# Patient Record
Sex: Female | Born: 1945 | Race: White | Hispanic: No | Smoking: Never smoker
Health system: Southern US, Community
[De-identification: ages and names within clinical notes are randomized; demographics above are authoritative.]

## PROBLEM LIST (undated history)

## (undated) DIAGNOSIS — M1990 Unspecified osteoarthritis, unspecified site: Secondary | ICD-10-CM

## (undated) DIAGNOSIS — J8410 Pulmonary fibrosis, unspecified: Secondary | ICD-10-CM

## (undated) DIAGNOSIS — E0500 Thyrotoxicosis with diffuse goiter without thyrotoxic crisis or storm: Secondary | ICD-10-CM

## (undated) DIAGNOSIS — E079 Disorder of thyroid, unspecified: Secondary | ICD-10-CM

## (undated) DIAGNOSIS — I10 Essential (primary) hypertension: Secondary | ICD-10-CM

## (undated) DIAGNOSIS — C801 Malignant (primary) neoplasm, unspecified: Secondary | ICD-10-CM

## (undated) HISTORY — DX: Essential (primary) hypertension: I10

## (undated) HISTORY — DX: Disorder of thyroid, unspecified: E07.9

## (undated) HISTORY — DX: Malignant (primary) neoplasm, unspecified: C80.1

## (undated) HISTORY — DX: Pulmonary fibrosis, unspecified: J84.10

## (undated) HISTORY — PX: COSMETIC SURGERY: SHX468

## (undated) HISTORY — DX: Unspecified osteoarthritis, unspecified site: M19.90

## (undated) HISTORY — DX: Thyrotoxicosis with diffuse goiter without thyrotoxic crisis or storm: E05.00

---

## 1982-02-24 DIAGNOSIS — I10 Essential (primary) hypertension: Secondary | ICD-10-CM

## 1982-02-24 HISTORY — DX: Essential (primary) hypertension: I10

## 1994-02-24 DIAGNOSIS — E0500 Thyrotoxicosis with diffuse goiter without thyrotoxic crisis or storm: Secondary | ICD-10-CM

## 1994-02-24 HISTORY — DX: Thyrotoxicosis with diffuse goiter without thyrotoxic crisis or storm: E05.00

## 1999-02-25 HISTORY — PX: ABDOMINAL HYSTERECTOMY: SHX81

## 2000-02-25 HISTORY — PX: COLONOSCOPY: SHX174

## 2000-02-25 HISTORY — PX: BREAST BIOPSY: SHX20

## 2000-02-25 HISTORY — PX: BREAST SURGERY: SHX581

## 2003-03-07 ENCOUNTER — Other Ambulatory Visit: Payer: Self-pay

## 2005-10-20 ENCOUNTER — Ambulatory Visit: Payer: Self-pay

## 2005-11-12 ENCOUNTER — Ambulatory Visit: Payer: Self-pay | Admitting: Family Medicine

## 2006-01-05 ENCOUNTER — Ambulatory Visit: Payer: Self-pay | Admitting: Urology

## 2008-02-25 DIAGNOSIS — C801 Malignant (primary) neoplasm, unspecified: Secondary | ICD-10-CM

## 2008-02-25 HISTORY — DX: Malignant (primary) neoplasm, unspecified: C80.1

## 2009-01-09 ENCOUNTER — Ambulatory Visit: Admission: RE | Admit: 2009-01-09 | Discharge: 2009-01-23 | Payer: Self-pay | Admitting: Radiation Oncology

## 2009-01-10 ENCOUNTER — Encounter: Admission: RE | Admit: 2009-01-10 | Discharge: 2009-01-10 | Payer: Self-pay | Admitting: Neurosurgery

## 2009-01-24 ENCOUNTER — Ambulatory Visit: Admission: RE | Admit: 2009-01-24 | Discharge: 2009-02-09 | Payer: Self-pay | Admitting: Radiation Oncology

## 2009-01-29 ENCOUNTER — Encounter: Admission: RE | Admit: 2009-01-29 | Discharge: 2009-01-29 | Payer: Self-pay | Admitting: General Surgery

## 2009-01-31 ENCOUNTER — Ambulatory Visit (HOSPITAL_BASED_OUTPATIENT_CLINIC_OR_DEPARTMENT_OTHER): Admission: RE | Admit: 2009-01-31 | Discharge: 2009-01-31 | Payer: Self-pay | Admitting: General Surgery

## 2009-02-14 ENCOUNTER — Ambulatory Visit: Payer: Self-pay | Admitting: Oncology

## 2009-02-24 ENCOUNTER — Ambulatory Visit: Payer: Self-pay | Admitting: Radiation Oncology

## 2009-02-26 ENCOUNTER — Ambulatory Visit: Payer: Self-pay | Admitting: Radiation Oncology

## 2009-03-01 ENCOUNTER — Ambulatory Visit (HOSPITAL_BASED_OUTPATIENT_CLINIC_OR_DEPARTMENT_OTHER): Admission: RE | Admit: 2009-03-01 | Discharge: 2009-03-01 | Payer: Self-pay | Admitting: General Surgery

## 2009-03-19 ENCOUNTER — Ambulatory Visit: Payer: Self-pay | Admitting: Oncology

## 2009-03-21 ENCOUNTER — Encounter: Payer: Self-pay | Admitting: Oncology

## 2009-03-21 ENCOUNTER — Ambulatory Visit: Admission: RE | Admit: 2009-03-21 | Discharge: 2009-03-21 | Payer: Self-pay | Admitting: Oncology

## 2009-03-21 ENCOUNTER — Ambulatory Visit: Payer: Self-pay | Admitting: Cardiology

## 2009-03-27 ENCOUNTER — Ambulatory Visit: Payer: Self-pay | Admitting: Radiation Oncology

## 2009-03-28 ENCOUNTER — Ambulatory Visit: Payer: Self-pay | Admitting: Oncology

## 2009-03-30 ENCOUNTER — Ambulatory Visit: Payer: Self-pay | Admitting: Internal Medicine

## 2009-04-24 ENCOUNTER — Ambulatory Visit: Payer: Self-pay | Admitting: Internal Medicine

## 2009-04-24 ENCOUNTER — Ambulatory Visit: Payer: Self-pay | Admitting: Radiation Oncology

## 2009-05-25 ENCOUNTER — Ambulatory Visit: Payer: Self-pay | Admitting: Radiation Oncology

## 2009-05-25 ENCOUNTER — Ambulatory Visit: Payer: Self-pay | Admitting: Internal Medicine

## 2009-06-24 ENCOUNTER — Ambulatory Visit: Payer: Self-pay | Admitting: Radiation Oncology

## 2009-06-24 ENCOUNTER — Ambulatory Visit: Payer: Self-pay | Admitting: Internal Medicine

## 2009-07-25 ENCOUNTER — Ambulatory Visit: Payer: Self-pay | Admitting: Radiation Oncology

## 2009-07-25 ENCOUNTER — Ambulatory Visit: Payer: Self-pay | Admitting: Internal Medicine

## 2009-08-04 ENCOUNTER — Emergency Department: Payer: Self-pay | Admitting: Unknown Physician Specialty

## 2009-08-24 ENCOUNTER — Ambulatory Visit: Payer: Self-pay | Admitting: Radiation Oncology

## 2009-08-24 ENCOUNTER — Ambulatory Visit: Payer: Self-pay | Admitting: Internal Medicine

## 2009-09-24 ENCOUNTER — Ambulatory Visit: Payer: Self-pay | Admitting: Internal Medicine

## 2009-09-24 ENCOUNTER — Ambulatory Visit: Payer: Self-pay | Admitting: Radiation Oncology

## 2009-10-25 ENCOUNTER — Ambulatory Visit: Payer: Self-pay | Admitting: Internal Medicine

## 2009-10-25 ENCOUNTER — Ambulatory Visit: Payer: Self-pay | Admitting: Radiation Oncology

## 2009-11-24 ENCOUNTER — Ambulatory Visit: Payer: Self-pay | Admitting: Radiation Oncology

## 2009-11-24 ENCOUNTER — Ambulatory Visit: Payer: Self-pay | Admitting: Internal Medicine

## 2009-12-25 ENCOUNTER — Ambulatory Visit: Payer: Self-pay | Admitting: Radiation Oncology

## 2010-01-22 ENCOUNTER — Ambulatory Visit: Payer: Self-pay | Admitting: Urology

## 2010-01-28 ENCOUNTER — Ambulatory Visit: Payer: Self-pay | Admitting: Internal Medicine

## 2010-02-24 ENCOUNTER — Ambulatory Visit: Payer: Self-pay | Admitting: Internal Medicine

## 2010-02-24 DIAGNOSIS — J8410 Pulmonary fibrosis, unspecified: Secondary | ICD-10-CM

## 2010-02-24 HISTORY — DX: Pulmonary fibrosis, unspecified: J84.10

## 2010-03-27 ENCOUNTER — Ambulatory Visit: Payer: Self-pay | Admitting: Internal Medicine

## 2010-04-26 ENCOUNTER — Ambulatory Visit: Payer: Self-pay | Admitting: Oncology

## 2010-05-07 ENCOUNTER — Ambulatory Visit: Payer: Self-pay | Admitting: Oncology

## 2010-05-10 LAB — CANCER ANTIGEN 27.29: CA 27.29: 26.2 U/mL (ref 0.0–38.6)

## 2010-05-12 LAB — BASIC METABOLIC PANEL
CO2: 29 mEq/L (ref 19–32)
Calcium: 9.2 mg/dL (ref 8.4–10.5)
Creatinine, Ser: 0.79 mg/dL (ref 0.4–1.2)
GFR calc non Af Amer: >60 mL/min (ref >60)
Glucose, Bld: 105 mg/dL — ABNORMAL HIGH (ref 70–99)

## 2010-05-26 ENCOUNTER — Ambulatory Visit: Payer: Self-pay | Admitting: Oncology

## 2010-05-28 LAB — DIFFERENTIAL
Basophils Absolute: 0 10*3/uL (ref 0.0–0.1)
Eosinophils Absolute: 0 10*3/uL (ref 0.0–0.7)
Eosinophils Relative: 0 % (ref 0–5)
Lymphs Abs: 1.8 10*3/uL (ref 0.7–4.0)
Neutro Abs: 3.7 10*3/uL (ref 1.7–7.7)

## 2010-05-28 LAB — COMPREHENSIVE METABOLIC PANEL
AST: 31 U/L (ref 0–37)
Chloride: 99 mEq/L (ref 96–112)
Creatinine, Ser: 0.96 mg/dL (ref 0.4–1.2)
GFR calc non Af Amer: 59 mL/min — ABNORMAL LOW (ref >60)
Glucose, Bld: 134 mg/dL — ABNORMAL HIGH (ref 70–99)
Sodium: 137 mEq/L (ref 135–145)
Total Bilirubin: 0.4 mg/dL (ref 0.3–1.2)
Total Protein: 6.9 g/dL (ref 6.0–8.3)

## 2010-05-28 LAB — POCT HEMOGLOBIN-HEMACUE: Hemoglobin: 14.8 g/dL (ref 12.0–15.0)

## 2010-05-28 LAB — CBC
HCT: 40.3 % (ref 36.0–46.0)
MCHC: 34.1 g/dL (ref 30.0–36.0)
MCV: 91.2 fL (ref 78.0–100.0)
RBC: 4.42 MIL/uL (ref 3.87–5.11)
RDW: 13 % (ref 11.5–15.5)
WBC: 5.9 10*3/uL (ref 4.0–10.5)

## 2010-06-25 ENCOUNTER — Ambulatory Visit: Payer: Self-pay | Admitting: Oncology

## 2010-07-26 ENCOUNTER — Ambulatory Visit: Payer: Self-pay | Admitting: Oncology

## 2010-08-25 ENCOUNTER — Ambulatory Visit: Payer: Self-pay | Admitting: Oncology

## 2010-10-03 ENCOUNTER — Ambulatory Visit: Payer: Self-pay | Admitting: Oncology

## 2010-10-26 ENCOUNTER — Ambulatory Visit: Payer: Self-pay | Admitting: Oncology

## 2010-11-25 ENCOUNTER — Ambulatory Visit: Payer: Self-pay | Admitting: Oncology

## 2010-12-26 ENCOUNTER — Ambulatory Visit: Payer: Self-pay | Admitting: Oncology

## 2010-12-27 ENCOUNTER — Encounter (INDEPENDENT_AMBULATORY_CARE_PROVIDER_SITE_OTHER): Payer: Self-pay | Admitting: General Surgery

## 2011-01-09 ENCOUNTER — Ambulatory Visit: Payer: Self-pay | Admitting: Family Medicine

## 2011-01-25 ENCOUNTER — Ambulatory Visit: Payer: Self-pay | Admitting: Oncology

## 2011-06-12 ENCOUNTER — Ambulatory Visit: Payer: Self-pay | Admitting: Oncology

## 2011-06-12 DIAGNOSIS — Z09 Encounter for follow-up examination after completed treatment for conditions other than malignant neoplasm: Secondary | ICD-10-CM | POA: Diagnosis not present

## 2011-06-12 DIAGNOSIS — Z923 Personal history of irradiation: Secondary | ICD-10-CM | POA: Diagnosis not present

## 2011-06-12 DIAGNOSIS — Z171 Estrogen receptor negative status [ER-]: Secondary | ICD-10-CM | POA: Diagnosis not present

## 2011-06-12 DIAGNOSIS — Z853 Personal history of malignant neoplasm of breast: Secondary | ICD-10-CM | POA: Diagnosis not present

## 2011-06-25 ENCOUNTER — Ambulatory Visit: Payer: Self-pay | Admitting: Oncology

## 2011-06-25 DIAGNOSIS — Z85828 Personal history of other malignant neoplasm of skin: Secondary | ICD-10-CM | POA: Diagnosis not present

## 2011-06-25 DIAGNOSIS — I10 Essential (primary) hypertension: Secondary | ICD-10-CM | POA: Diagnosis not present

## 2011-06-25 DIAGNOSIS — Z853 Personal history of malignant neoplasm of breast: Secondary | ICD-10-CM | POA: Diagnosis not present

## 2011-06-25 DIAGNOSIS — Z90710 Acquired absence of both cervix and uterus: Secondary | ICD-10-CM | POA: Diagnosis not present

## 2011-06-25 DIAGNOSIS — Z9010 Acquired absence of unspecified breast and nipple: Secondary | ICD-10-CM | POA: Diagnosis not present

## 2011-06-25 DIAGNOSIS — M810 Age-related osteoporosis without current pathological fracture: Secondary | ICD-10-CM | POA: Diagnosis not present

## 2011-06-25 DIAGNOSIS — Z171 Estrogen receptor negative status [ER-]: Secondary | ICD-10-CM | POA: Diagnosis not present

## 2011-06-25 DIAGNOSIS — E0500 Thyrotoxicosis with diffuse goiter without thyrotoxic crisis or storm: Secondary | ICD-10-CM | POA: Diagnosis not present

## 2011-06-25 DIAGNOSIS — Z79899 Other long term (current) drug therapy: Secondary | ICD-10-CM | POA: Diagnosis not present

## 2011-06-25 DIAGNOSIS — Z9221 Personal history of antineoplastic chemotherapy: Secondary | ICD-10-CM | POA: Diagnosis not present

## 2011-07-07 DIAGNOSIS — L259 Unspecified contact dermatitis, unspecified cause: Secondary | ICD-10-CM | POA: Diagnosis not present

## 2011-07-22 DIAGNOSIS — Z853 Personal history of malignant neoplasm of breast: Secondary | ICD-10-CM | POA: Diagnosis not present

## 2011-07-22 DIAGNOSIS — Z171 Estrogen receptor negative status [ER-]: Secondary | ICD-10-CM | POA: Diagnosis not present

## 2011-07-22 LAB — CBC CANCER CENTER
Basophil #: 0 x10 3/mm (ref 0.0–0.1)
Eosinophil #: 0 x10 3/mm (ref 0.0–0.7)
HCT: 41.3 % (ref 35.0–47.0)
HGB: 13.7 g/dL (ref 12.0–16.0)
Lymphocyte #: 1.1 x10 3/mm (ref 1.0–3.6)
Lymphocyte %: 24.2 %
MCHC: 33.2 g/dL (ref 32.0–36.0)
MCV: 94 fL (ref 80–100)
Monocyte %: 10.2 %
Neutrophil #: 2.9 x10 3/mm (ref 1.4–6.5)
Platelet: 185 x10 3/mm (ref 150–440)
RDW: 13 % (ref 11.5–14.5)
WBC: 4.5 x10 3/mm (ref 3.6–11.0)

## 2011-07-22 LAB — COMPREHENSIVE METABOLIC PANEL
Albumin: 3.6 g/dL (ref 3.4–5.0)
Anion Gap: 7 (ref 7–16)
Bilirubin,Total: 0.4 mg/dL (ref 0.2–1.0)
Creatinine: 0.84 mg/dL (ref 0.60–1.30)
EGFR (African American): >60
EGFR (Non-African Amer.): >60
Glucose: 94 mg/dL (ref 65–99)
Osmolality: 281 (ref 275–301)
Potassium: 3.6 mmol/L (ref 3.5–5.1)
SGOT(AST): 34 U/L (ref 15–37)
Total Protein: 7.1 g/dL (ref 6.4–8.2)

## 2011-07-26 ENCOUNTER — Ambulatory Visit: Payer: Self-pay | Admitting: Oncology

## 2011-08-14 DIAGNOSIS — L570 Actinic keratosis: Secondary | ICD-10-CM | POA: Diagnosis not present

## 2011-08-14 DIAGNOSIS — Z85828 Personal history of other malignant neoplasm of skin: Secondary | ICD-10-CM | POA: Diagnosis not present

## 2011-12-09 ENCOUNTER — Emergency Department: Payer: Self-pay | Admitting: Unknown Physician Specialty

## 2011-12-09 DIAGNOSIS — I10 Essential (primary) hypertension: Secondary | ICD-10-CM | POA: Diagnosis not present

## 2011-12-09 DIAGNOSIS — Z79899 Other long term (current) drug therapy: Secondary | ICD-10-CM | POA: Diagnosis not present

## 2011-12-09 DIAGNOSIS — F29 Unspecified psychosis not due to a substance or known physiological condition: Secondary | ICD-10-CM | POA: Diagnosis not present

## 2011-12-09 DIAGNOSIS — J019 Acute sinusitis, unspecified: Secondary | ICD-10-CM | POA: Diagnosis not present

## 2011-12-09 DIAGNOSIS — R4182 Altered mental status, unspecified: Secondary | ICD-10-CM | POA: Diagnosis not present

## 2011-12-09 DIAGNOSIS — R9431 Abnormal electrocardiogram [ECG] [EKG]: Secondary | ICD-10-CM | POA: Diagnosis not present

## 2011-12-09 LAB — URINALYSIS, COMPLETE
Bacteria: NONE SEEN
Bilirubin,UR: NEGATIVE
Glucose,UR: NEGATIVE mg/dL (ref 0–75)
Nitrite: NEGATIVE
Ph: 7 (ref 4.5–8.0)
RBC,UR: 4 /HPF (ref 0–5)
Squamous Epithelial: <1
WBC UR: NONE SEEN /HPF (ref 0–5)

## 2011-12-09 LAB — DRUG SCREEN, URINE
Cannabinoid 50 Ng, Ur ~~LOC~~: NEGATIVE (ref ?–50)
Cocaine Metabolite,Ur ~~LOC~~: NEGATIVE (ref ?–300)
MDMA (Ecstasy)Ur Screen: NEGATIVE (ref ?–500)
Methadone, Ur Screen: NEGATIVE (ref ?–300)
Phencyclidine (PCP) Ur S: NEGATIVE (ref ?–25)

## 2011-12-09 LAB — CBC WITH DIFFERENTIAL/PLATELET
Basophil #: 0 10*3/uL (ref 0.0–0.1)
HCT: 43.7 % (ref 35.0–47.0)
HGB: 15.1 g/dL (ref 12.0–16.0)
Lymphocyte %: 32.6 %
MCV: 92 fL (ref 80–100)
Monocyte %: 10 %
Neutrophil #: 2.8 10*3/uL (ref 1.4–6.5)
RDW: 13.1 % (ref 11.5–14.5)
WBC: 5 10*3/uL (ref 3.6–11.0)

## 2011-12-09 LAB — COMPREHENSIVE METABOLIC PANEL
Albumin: 4 g/dL (ref 3.4–5.0)
Alkaline Phosphatase: 122 U/L (ref 50–136)
Calcium, Total: 8.9 mg/dL (ref 8.5–10.1)
Co2: 28 mmol/L (ref 21–32)
EGFR (Non-African Amer.): >60
Glucose: 101 mg/dL — ABNORMAL HIGH (ref 65–99)
Osmolality: 282 (ref 275–301)
SGOT(AST): 43 U/L — ABNORMAL HIGH (ref 15–37)
SGPT (ALT): 49 U/L (ref 12–78)
Sodium: 141 mmol/L (ref 136–145)

## 2011-12-09 LAB — TROPONIN I: Troponin-I: <0.02 ng/mL

## 2011-12-11 DIAGNOSIS — R4182 Altered mental status, unspecified: Secondary | ICD-10-CM | POA: Diagnosis not present

## 2011-12-11 DIAGNOSIS — N390 Urinary tract infection, site not specified: Secondary | ICD-10-CM | POA: Diagnosis not present

## 2011-12-11 DIAGNOSIS — J019 Acute sinusitis, unspecified: Secondary | ICD-10-CM | POA: Diagnosis not present

## 2011-12-11 DIAGNOSIS — G459 Transient cerebral ischemic attack, unspecified: Secondary | ICD-10-CM | POA: Diagnosis not present

## 2011-12-16 ENCOUNTER — Ambulatory Visit: Payer: Self-pay | Admitting: Family Medicine

## 2011-12-16 DIAGNOSIS — G459 Transient cerebral ischemic attack, unspecified: Secondary | ICD-10-CM | POA: Diagnosis not present

## 2011-12-23 DIAGNOSIS — Z853 Personal history of malignant neoplasm of breast: Secondary | ICD-10-CM | POA: Diagnosis not present

## 2011-12-29 DIAGNOSIS — C50919 Malignant neoplasm of unspecified site of unspecified female breast: Secondary | ICD-10-CM | POA: Diagnosis not present

## 2011-12-29 DIAGNOSIS — Z23 Encounter for immunization: Secondary | ICD-10-CM | POA: Diagnosis not present

## 2011-12-29 DIAGNOSIS — J019 Acute sinusitis, unspecified: Secondary | ICD-10-CM | POA: Diagnosis not present

## 2011-12-29 DIAGNOSIS — R413 Other amnesia: Secondary | ICD-10-CM | POA: Diagnosis not present

## 2011-12-29 DIAGNOSIS — I10 Essential (primary) hypertension: Secondary | ICD-10-CM | POA: Diagnosis not present

## 2012-01-28 ENCOUNTER — Ambulatory Visit: Payer: Self-pay | Admitting: Oncology

## 2012-01-28 DIAGNOSIS — I10 Essential (primary) hypertension: Secondary | ICD-10-CM | POA: Diagnosis not present

## 2012-01-28 DIAGNOSIS — R220 Localized swelling, mass and lump, head: Secondary | ICD-10-CM | POA: Diagnosis not present

## 2012-01-28 DIAGNOSIS — Z923 Personal history of irradiation: Secondary | ICD-10-CM | POA: Diagnosis not present

## 2012-01-28 DIAGNOSIS — J0130 Acute sphenoidal sinusitis, unspecified: Secondary | ICD-10-CM | POA: Diagnosis not present

## 2012-01-28 DIAGNOSIS — Z853 Personal history of malignant neoplasm of breast: Secondary | ICD-10-CM | POA: Diagnosis not present

## 2012-01-28 DIAGNOSIS — Z9221 Personal history of antineoplastic chemotherapy: Secondary | ICD-10-CM | POA: Diagnosis not present

## 2012-01-28 DIAGNOSIS — E785 Hyperlipidemia, unspecified: Secondary | ICD-10-CM | POA: Diagnosis not present

## 2012-01-28 DIAGNOSIS — Z8673 Personal history of transient ischemic attack (TIA), and cerebral infarction without residual deficits: Secondary | ICD-10-CM | POA: Diagnosis not present

## 2012-01-28 DIAGNOSIS — Z7982 Long term (current) use of aspirin: Secondary | ICD-10-CM | POA: Diagnosis not present

## 2012-01-28 DIAGNOSIS — Z79899 Other long term (current) drug therapy: Secondary | ICD-10-CM | POA: Diagnosis not present

## 2012-01-28 DIAGNOSIS — H9209 Otalgia, unspecified ear: Secondary | ICD-10-CM | POA: Diagnosis not present

## 2012-01-28 DIAGNOSIS — M810 Age-related osteoporosis without current pathological fracture: Secondary | ICD-10-CM | POA: Diagnosis not present

## 2012-01-28 DIAGNOSIS — E0500 Thyrotoxicosis with diffuse goiter without thyrotoxic crisis or storm: Secondary | ICD-10-CM | POA: Diagnosis not present

## 2012-01-28 DIAGNOSIS — R070 Pain in throat: Secondary | ICD-10-CM | POA: Diagnosis not present

## 2012-01-28 DIAGNOSIS — R221 Localized swelling, mass and lump, neck: Secondary | ICD-10-CM | POA: Diagnosis not present

## 2012-01-28 DIAGNOSIS — Z90710 Acquired absence of both cervix and uterus: Secondary | ICD-10-CM | POA: Diagnosis not present

## 2012-01-28 DIAGNOSIS — R1310 Dysphagia, unspecified: Secondary | ICD-10-CM | POA: Diagnosis not present

## 2012-01-28 DIAGNOSIS — Z171 Estrogen receptor negative status [ER-]: Secondary | ICD-10-CM | POA: Diagnosis not present

## 2012-01-28 DIAGNOSIS — R599 Enlarged lymph nodes, unspecified: Secondary | ICD-10-CM | POA: Diagnosis not present

## 2012-01-28 DIAGNOSIS — H6980 Other specified disorders of Eustachian tube, unspecified ear: Secondary | ICD-10-CM | POA: Diagnosis not present

## 2012-01-28 LAB — COMPREHENSIVE METABOLIC PANEL
Albumin: 4 g/dL (ref 3.4–5.0)
Anion Gap: 11 (ref 7–16)
BUN: 15 mg/dL (ref 7–18)
Bilirubin,Total: 0.4 mg/dL (ref 0.2–1.0)
Calcium, Total: 9.3 mg/dL (ref 8.5–10.1)
Chloride: 101 mmol/L (ref 98–107)
Co2: 29 mmol/L (ref 21–32)
Potassium: 3.8 mmol/L (ref 3.5–5.1)
SGOT(AST): 40 U/L — ABNORMAL HIGH (ref 15–37)

## 2012-01-28 LAB — CBC CANCER CENTER
Basophil %: 0.6 %
Eosinophil %: 1.1 %
HGB: 15 g/dL (ref 12.0–16.0)
Lymphocyte %: 33.7 %
MCH: 32.3 pg (ref 26.0–34.0)
MCV: 93 fL (ref 80–100)
Monocyte %: 9.9 %
Neutrophil #: 2.3 x10 3/mm (ref 1.4–6.5)
Neutrophil %: 54.7 %
RBC: 4.64 10*6/uL (ref 3.80–5.20)
WBC: 4.3 x10 3/mm (ref 3.6–11.0)

## 2012-01-29 LAB — CANCER ANTIGEN 27.29: CA 27.29: 26.7 U/mL

## 2012-02-03 DIAGNOSIS — Z1331 Encounter for screening for depression: Secondary | ICD-10-CM | POA: Diagnosis not present

## 2012-02-03 DIAGNOSIS — C50919 Malignant neoplasm of unspecified site of unspecified female breast: Secondary | ICD-10-CM | POA: Diagnosis not present

## 2012-02-03 DIAGNOSIS — Z23 Encounter for immunization: Secondary | ICD-10-CM | POA: Diagnosis not present

## 2012-02-03 DIAGNOSIS — Z0000 Encounter for general adult medical examination without abnormal findings: Secondary | ICD-10-CM | POA: Diagnosis not present

## 2012-02-03 DIAGNOSIS — Z1339 Encounter for screening examination for other mental health and behavioral disorders: Secondary | ICD-10-CM | POA: Diagnosis not present

## 2012-02-13 DIAGNOSIS — J0130 Acute sphenoidal sinusitis, unspecified: Secondary | ICD-10-CM | POA: Diagnosis not present

## 2012-02-25 ENCOUNTER — Ambulatory Visit: Payer: Self-pay | Admitting: Oncology

## 2012-03-18 DIAGNOSIS — H02839 Dermatochalasis of unspecified eye, unspecified eyelid: Secondary | ICD-10-CM | POA: Diagnosis not present

## 2012-03-18 DIAGNOSIS — H2510 Age-related nuclear cataract, unspecified eye: Secondary | ICD-10-CM | POA: Diagnosis not present

## 2012-03-23 DIAGNOSIS — H02839 Dermatochalasis of unspecified eye, unspecified eyelid: Secondary | ICD-10-CM | POA: Diagnosis not present

## 2012-03-23 DIAGNOSIS — H5340 Unspecified visual field defects: Secondary | ICD-10-CM | POA: Diagnosis not present

## 2012-03-26 DIAGNOSIS — H0230 Blepharochalasis unspecified eye, unspecified eyelid: Secondary | ICD-10-CM | POA: Diagnosis not present

## 2012-04-01 DIAGNOSIS — H02839 Dermatochalasis of unspecified eye, unspecified eyelid: Secondary | ICD-10-CM | POA: Diagnosis not present

## 2012-04-01 DIAGNOSIS — H5340 Unspecified visual field defects: Secondary | ICD-10-CM | POA: Diagnosis not present

## 2012-06-10 ENCOUNTER — Ambulatory Visit: Payer: Self-pay | Admitting: Radiation Oncology

## 2012-06-20 DIAGNOSIS — N63 Unspecified lump in unspecified breast: Secondary | ICD-10-CM | POA: Insufficient documentation

## 2012-07-25 ENCOUNTER — Ambulatory Visit: Payer: Self-pay | Admitting: Oncology

## 2012-07-25 DIAGNOSIS — Z85828 Personal history of other malignant neoplasm of skin: Secondary | ICD-10-CM | POA: Diagnosis not present

## 2012-07-25 DIAGNOSIS — E0500 Thyrotoxicosis with diffuse goiter without thyrotoxic crisis or storm: Secondary | ICD-10-CM | POA: Diagnosis not present

## 2012-07-25 DIAGNOSIS — Z171 Estrogen receptor negative status [ER-]: Secondary | ICD-10-CM | POA: Diagnosis not present

## 2012-07-25 DIAGNOSIS — R220 Localized swelling, mass and lump, head: Secondary | ICD-10-CM | POA: Diagnosis not present

## 2012-07-25 DIAGNOSIS — Z853 Personal history of malignant neoplasm of breast: Secondary | ICD-10-CM | POA: Diagnosis not present

## 2012-07-25 DIAGNOSIS — I10 Essential (primary) hypertension: Secondary | ICD-10-CM | POA: Diagnosis not present

## 2012-07-25 DIAGNOSIS — Z79899 Other long term (current) drug therapy: Secondary | ICD-10-CM | POA: Diagnosis not present

## 2012-07-25 DIAGNOSIS — Z7982 Long term (current) use of aspirin: Secondary | ICD-10-CM | POA: Diagnosis not present

## 2012-07-25 DIAGNOSIS — Z90710 Acquired absence of both cervix and uterus: Secondary | ICD-10-CM | POA: Diagnosis not present

## 2012-07-25 DIAGNOSIS — M810 Age-related osteoporosis without current pathological fracture: Secondary | ICD-10-CM | POA: Diagnosis not present

## 2012-07-25 DIAGNOSIS — E7800 Pure hypercholesterolemia, unspecified: Secondary | ICD-10-CM | POA: Diagnosis not present

## 2012-07-25 DIAGNOSIS — Z9079 Acquired absence of other genital organ(s): Secondary | ICD-10-CM | POA: Diagnosis not present

## 2012-07-28 DIAGNOSIS — R220 Localized swelling, mass and lump, head: Secondary | ICD-10-CM | POA: Diagnosis not present

## 2012-07-28 DIAGNOSIS — Z171 Estrogen receptor negative status [ER-]: Secondary | ICD-10-CM | POA: Diagnosis not present

## 2012-07-28 DIAGNOSIS — R221 Localized swelling, mass and lump, neck: Secondary | ICD-10-CM | POA: Diagnosis not present

## 2012-07-28 DIAGNOSIS — Z853 Personal history of malignant neoplasm of breast: Secondary | ICD-10-CM | POA: Diagnosis not present

## 2012-07-28 LAB — COMPREHENSIVE METABOLIC PANEL
Albumin: 3.8 g/dL (ref 3.4–5.0)
Anion Gap: 5 — ABNORMAL LOW (ref 7–16)
BUN: 14 mg/dL (ref 7–18)
Bilirubin,Total: 0.4 mg/dL (ref 0.2–1.0)
Chloride: 102 mmol/L (ref 98–107)
Co2: 31 mmol/L (ref 21–32)
EGFR (African American): >60
EGFR (Non-African Amer.): >60
Glucose: 105 mg/dL — ABNORMAL HIGH (ref 65–99)
Osmolality: 277 (ref 275–301)
Potassium: 4 mmol/L (ref 3.5–5.1)
SGOT(AST): 31 U/L (ref 15–37)
SGPT (ALT): 38 U/L (ref 12–78)
Sodium: 138 mmol/L (ref 136–145)
Total Protein: 7.5 g/dL (ref 6.4–8.2)

## 2012-07-28 LAB — CBC CANCER CENTER
Basophil #: 0 x10 3/mm (ref 0.0–0.1)
Basophil %: 0.4 %
Eosinophil #: 0 x10 3/mm (ref 0.0–0.7)
Eosinophil %: 0.7 %
Lymphocyte #: 1.2 x10 3/mm (ref 1.0–3.6)
MCH: 32.3 pg (ref 26.0–34.0)
MCHC: 34.5 g/dL (ref 32.0–36.0)
MCV: 93 fL (ref 80–100)
Platelet: 203 x10 3/mm (ref 150–440)
RDW: 13.6 % (ref 11.5–14.5)
WBC: 4.8 x10 3/mm (ref 3.6–11.0)

## 2012-08-13 DIAGNOSIS — Z85828 Personal history of other malignant neoplasm of skin: Secondary | ICD-10-CM | POA: Diagnosis not present

## 2012-08-13 DIAGNOSIS — D235 Other benign neoplasm of skin of trunk: Secondary | ICD-10-CM | POA: Diagnosis not present

## 2012-08-23 DIAGNOSIS — I10 Essential (primary) hypertension: Secondary | ICD-10-CM | POA: Diagnosis not present

## 2012-08-23 DIAGNOSIS — C50919 Malignant neoplasm of unspecified site of unspecified female breast: Secondary | ICD-10-CM | POA: Diagnosis not present

## 2012-08-23 DIAGNOSIS — Z23 Encounter for immunization: Secondary | ICD-10-CM | POA: Diagnosis not present

## 2012-08-23 DIAGNOSIS — G459 Transient cerebral ischemic attack, unspecified: Secondary | ICD-10-CM | POA: Diagnosis not present

## 2012-08-24 ENCOUNTER — Ambulatory Visit: Payer: Self-pay | Admitting: Oncology

## 2012-09-09 ENCOUNTER — Encounter: Payer: Self-pay | Admitting: *Deleted

## 2012-09-09 DIAGNOSIS — Z853 Personal history of malignant neoplasm of breast: Secondary | ICD-10-CM | POA: Insufficient documentation

## 2012-09-09 DIAGNOSIS — C801 Malignant (primary) neoplasm, unspecified: Secondary | ICD-10-CM | POA: Insufficient documentation

## 2012-09-16 ENCOUNTER — Encounter: Payer: Self-pay | Admitting: General Surgery

## 2012-09-16 ENCOUNTER — Ambulatory Visit (INDEPENDENT_AMBULATORY_CARE_PROVIDER_SITE_OTHER): Payer: Medicare Other | Admitting: General Surgery

## 2012-09-16 ENCOUNTER — Other Ambulatory Visit: Payer: Self-pay

## 2012-09-16 ENCOUNTER — Other Ambulatory Visit: Payer: Self-pay | Admitting: *Deleted

## 2012-09-16 VITALS — BP 120/80 | HR 86 | Resp 14 | Ht 64.0 in | Wt 126.0 lb

## 2012-09-16 DIAGNOSIS — G459 Transient cerebral ischemic attack, unspecified: Secondary | ICD-10-CM | POA: Diagnosis not present

## 2012-09-16 DIAGNOSIS — N63 Unspecified lump in unspecified breast: Secondary | ICD-10-CM

## 2012-09-16 DIAGNOSIS — C50919 Malignant neoplasm of unspecified site of unspecified female breast: Secondary | ICD-10-CM

## 2012-09-16 DIAGNOSIS — N390 Urinary tract infection, site not specified: Secondary | ICD-10-CM | POA: Diagnosis not present

## 2012-09-16 DIAGNOSIS — R300 Dysuria: Secondary | ICD-10-CM | POA: Diagnosis not present

## 2012-09-16 DIAGNOSIS — Z1211 Encounter for screening for malignant neoplasm of colon: Secondary | ICD-10-CM

## 2012-09-16 DIAGNOSIS — Z23 Encounter for immunization: Secondary | ICD-10-CM | POA: Diagnosis not present

## 2012-09-16 NOTE — Progress Notes (Signed)
[facility] will contact patient to arrange for bilateral breast MRI.

## 2012-09-16 NOTE — Patient Instructions (Addendum)
Colonoscopy A colonoscopy is an exam to evaluate your entire colon. In this exam, your colon is cleansed. A long fiberoptic tube is inserted through your rectum and into your colon. The fiberoptic scope (endoscope) is a long bundle of enclosed and very flexible fibers. These fibers transmit light to the area examined and send images from that area to your caregiver. Discomfort is usually minimal. You may be given a drug to help you sleep (sedative) during or prior to the procedure. This exam helps to detect lumps (tumors), polyps, inflammation, and areas of bleeding. Your caregiver may also take a small piece of tissue (biopsy) that will be examined under a microscope. LET YOUR CAREGIVER KNOW ABOUT:   Allergies to food or medicine.  Medicines taken, including vitamins, herbs, eyedrops, over-the-counter medicines, and creams.  Use of steroids (by mouth or creams).  Previous problems with anesthetics or numbing medicines.  History of bleeding problems or blood clots.  Previous surgery.  Other health problems, including diabetes and kidney problems.  Possibility of pregnancy, if this applies. BEFORE THE PROCEDURE   A clear liquid diet may be required for 2 days before the exam.  Ask your caregiver about changing or stopping your regular medications.  Liquid injections (enemas) or laxatives may be required.  A large amount of electrolyte solution may be given to you to drink over a short period of time. This solution is used to clean out your colon.  You should be present 60 minutes prior to your procedure or as directed by your caregiver. AFTER THE PROCEDURE   If you received a sedative or pain relieving medication, you will need to arrange for someone to drive you home.  Occasionally, there is a little blood passed with the first bowel movement. Do not be concerned. FINDING OUT THE RESULTS OF YOUR TEST Not all test results are available during your visit. If your test results are  not back during the visit, make an appointment with your caregiver to find out the results. Do not assume everything is normal if you have not heard from your caregiver or the medical facility. It is important for you to follow up on all of your test results. HOME CARE INSTRUCTIONS   It is not unusual to pass moderate amounts of gas and experience mild abdominal cramping following the procedure. This is due to air being used to inflate your colon during the exam. Walking or a warm pack on your belly (abdomen) may help.  You may resume all normal meals and activities after sedatives and medicines have worn off.  Only take over-the-counter or prescription medicines for pain, discomfort, or fever as directed by your caregiver. Do not use aspirin or blood thinners if a biopsy was taken. Consult your caregiver for medicine usage if biopsies were taken. SEEK IMMEDIATE MEDICAL CARE IF:   You have a fever.  You pass large blood clots or fill a toilet with blood following the procedure. This may also occur 10 to 14 days following the procedure. This is more likely if a biopsy was taken.  You develop abdominal pain that keeps getting worse and cannot be relieved with medicine. Document Released: 02/08/2000 Document Revised: 05/05/2011 Document Reviewed: 09/23/2007 [facility] Patient Information 2014 [place], [place].  Patient to be contacted once September 2014 schedule is available to arrange for colonoscopy. [facility] will contact patient to arrange for breast MRI.

## 2012-09-16 NOTE — Progress Notes (Signed)
Patient ID: Anna Mcmillan, female   DOB: November 11, 1945, 67 y.o.   MRN: [id number]  Chief Complaint  Patient presents with  . Other    colonoscopy    HPI Anna Mcmillan is a 67 y.o. female here today for an colonoscopy screening.Patient had an colonoscopy in 2002. No GI problems.  The patient reported incidentally that she appreciated a change in the contour of her left breast in the last few months. She is followed by [name], M.D. Having undergone treatment of left breast cancer in the past. Her last mammograms were September 2013.  The patient denies any history of trauma to the breast. She did receive whole breast radiation after wide local excision for treatment of her malignancy. In the distant past, 30+ years, she had undergone bilateral breast augmentation with silicon prostheses.  HPI  Past Medical History  Diagnosis Date  . Cancer 2010    left breast   . Hypertension 1984  . Arthritis   . Thyroid disease   . Pulmonary fibrosis 2012  . Graves disease   . Graves disease 1996    Past Surgical History  Procedure Laterality Date  . Abdominal hysterectomy  2001  . Colonoscopy  2002  . Breast surgery Left 2002    lumpectomy,   . Breast biopsy Left 2002  . Cosmetic surgery Left     implant 1977    No family history on file.  Social History History  Substance Use Topics  . Smoking status: Never Smoker   . Smokeless tobacco: Never Used  . Alcohol Use: Yes    Allergies  Allergen Reactions  . Sulfa Antibiotics     Current Outpatient Prescriptions  Medication Sig Dispense Refill  . amLODipine (NORVASC) 5 MG tablet Take 5 mg by mouth daily.      . Calcium-Vitamin D-Vitamin K (CALCIUM + D + K PO) Take 1 tablet by mouth daily.      Anna Mcmillan Oil 1000 MG CAPS Take 1 capsule by mouth daily.      . Multiple Vitamins-Minerals (MULTIVITAMIN WITH MINERALS) tablet Take 1 tablet by mouth daily.       No current facility-administered medications for this visit.     Review of Systems Review of Systems  Constitutional: Negative.   Respiratory: Negative.   Cardiovascular: Negative.     Blood pressure 120/80, pulse 86, resp. rate 14, height 5\' 4"  (1.626 m), weight 126 lb (57.153 kg).  Physical Exam Physical Exam  Constitutional: She is oriented to person, place, and time. She appears well-developed and well-nourished.  Cardiovascular: Normal rate, regular rhythm and normal heart sounds.   Pulmonary/Chest: Breath sounds normal. Right breast exhibits no inverted nipple, no mass, no nipple discharge, no skin change and no tenderness. Left breast exhibits no inverted nipple, no mass, no nipple discharge, no skin change and no tenderness. Breasts are asymmetrical (left breast small than right).    Subareolar incision on both sides  Neurological: She is alert and oriented to person, place, and time.  Skin: Skin is warm and dry.    Data Reviewed Primary care physician nodes dated August 23, 2012. Ultrasound examination of the left breast an area of prominent asymmetry shows the underlying breast prosthesis approximating the overlying skin within about 7 mm. No separate masses appreciated.  Assessment    Candidate for screening colonoscopy. New-onset breast asymmetry status post wide local excision of the whole breast radiation. Possible delayed implant rupture post radiation therapy.     Plan  Breast MRI is recommended to assess the implants and to confirm that no recurrent breast cancer has developed. We'll attempt to arrange this if cleared by her insurance company.     Patient to be contacted once September 2014 schedule is available to arrange for colonoscopy. Miralax prescription will be sent to pharmacy once date arranged. [facility] will contact patient to arrange for breast MRI.    [name] 09/19/2012, 8:51 PM

## 2012-09-19 ENCOUNTER — Encounter: Payer: Self-pay | Admitting: General Surgery

## 2012-09-19 DIAGNOSIS — Z1211 Encounter for screening for malignant neoplasm of colon: Secondary | ICD-10-CM | POA: Insufficient documentation

## 2012-09-22 DIAGNOSIS — H35039 Hypertensive retinopathy, unspecified eye: Secondary | ICD-10-CM | POA: Diagnosis not present

## 2012-09-23 ENCOUNTER — Telehealth: Payer: Self-pay | Admitting: *Deleted

## 2012-09-23 DIAGNOSIS — Z1211 Encounter for screening for malignant neoplasm of colon: Secondary | ICD-10-CM

## 2012-09-23 MED ORDER — POLYETHYLENE GLYCOL 3350 17 GM/SCOOP PO POWD: ORAL | Status: DC

## 2012-09-23 NOTE — Telephone Encounter (Signed)
Patient was contacted today to arrange for her colonoscopy since September 2014 schedule is available. This has been arranged at [facility] for 11-10-12. She will be contacted prior to verify no medication changes. Miralax prescription was sent to patient's pharmacy.  This patient has been asked to discontinue fish oil one week prior to procedure.

## 2012-09-24 ENCOUNTER — Ambulatory Visit
Admission: RE | Admit: 2012-09-24 | Discharge: 2012-09-24 | Disposition: A | Discharge status: Home / Self Care | Payer: Medicare Other | Source: Ambulatory Visit | Attending: General Surgery | Admitting: General Surgery

## 2012-09-24 DIAGNOSIS — Z853 Personal history of malignant neoplasm of breast: Secondary | ICD-10-CM | POA: Diagnosis not present

## 2012-09-24 MED ORDER — GADOBENATE DIMEGLUMINE 529 MG/ML IV SOLN
12.0000 mL | Freq: Once | INTRAVENOUS | Status: AC | PRN
  Administered 2012-09-24: 12 mL via INTRAVENOUS

## 2012-10-12 ENCOUNTER — Telehealth: Payer: Self-pay | Admitting: *Deleted

## 2012-10-12 NOTE — Telephone Encounter (Signed)
Anna Mcmillan with [facility] called to confirm notes documented from the recent MRI. Informed notes sent to Dr Doylene Canning as well.

## 2012-10-28 ENCOUNTER — Telehealth: Payer: Self-pay | Admitting: *Deleted

## 2012-10-28 NOTE — Telephone Encounter (Signed)
Patient called to reschedule colonoscopy that was scheduled for 11-10-12 to 12-14-12. Her husband is having a lot of health issues and she would like to postpone for now. She was reminded to discontinue fish oil one week prior to procedure.  Trish in endoscopy notified of date change.  This patient would personally like to thank you for all you did as far as seeing her for her breast and recommending breast MRI. She is scheduled to have further testing.

## 2012-11-10 DIAGNOSIS — N63 Unspecified lump in unspecified breast: Secondary | ICD-10-CM | POA: Diagnosis not present

## 2012-12-04 ENCOUNTER — Other Ambulatory Visit: Payer: Self-pay | Admitting: General Surgery

## 2012-12-04 DIAGNOSIS — Z1211 Encounter for screening for malignant neoplasm of colon: Secondary | ICD-10-CM

## 2012-12-06 ENCOUNTER — Telehealth: Payer: Self-pay | Admitting: *Deleted

## 2012-12-06 NOTE — Telephone Encounter (Signed)
Patient called back to report no medication changes since last office visit. She was reminded to discontinue fish oil one week prior. Patient states she will pre-register by phone tomorrow and states she already has Miralax prescription. We will proceed with colonoscopy that is scheduled for 12-14-12 at [facility]. She was instructed to call the office if she has further questions.

## 2012-12-06 NOTE — Telephone Encounter (Signed)
Message for patient to call the office.  She is scheduled for a colonoscopy on 12-14-12 at [facility]. We need to review medications.

## 2012-12-14 ENCOUNTER — Ambulatory Visit: Payer: Self-pay | Admitting: General Surgery

## 2012-12-14 DIAGNOSIS — M129 Arthropathy, unspecified: Secondary | ICD-10-CM | POA: Diagnosis not present

## 2012-12-14 DIAGNOSIS — Z882 Allergy status to sulfonamides status: Secondary | ICD-10-CM | POA: Diagnosis not present

## 2012-12-14 DIAGNOSIS — Z79899 Other long term (current) drug therapy: Secondary | ICD-10-CM | POA: Diagnosis not present

## 2012-12-14 DIAGNOSIS — I10 Essential (primary) hypertension: Secondary | ICD-10-CM | POA: Diagnosis not present

## 2012-12-14 DIAGNOSIS — K5730 Diverticulosis of large intestine without perforation or abscess without bleeding: Secondary | ICD-10-CM | POA: Diagnosis not present

## 2012-12-14 DIAGNOSIS — J8410 Pulmonary fibrosis, unspecified: Secondary | ICD-10-CM | POA: Diagnosis not present

## 2012-12-14 DIAGNOSIS — Z853 Personal history of malignant neoplasm of breast: Secondary | ICD-10-CM | POA: Diagnosis not present

## 2012-12-14 DIAGNOSIS — Z1211 Encounter for screening for malignant neoplasm of colon: Secondary | ICD-10-CM | POA: Diagnosis not present

## 2012-12-14 DIAGNOSIS — E0500 Thyrotoxicosis with diffuse goiter without thyrotoxic crisis or storm: Secondary | ICD-10-CM | POA: Diagnosis not present

## 2012-12-15 ENCOUNTER — Encounter: Payer: Self-pay | Admitting: General Surgery

## 2013-01-26 ENCOUNTER — Ambulatory Visit: Payer: Self-pay | Admitting: Oncology

## 2013-01-26 DIAGNOSIS — Z79899 Other long term (current) drug therapy: Secondary | ICD-10-CM | POA: Diagnosis not present

## 2013-01-26 DIAGNOSIS — M810 Age-related osteoporosis without current pathological fracture: Secondary | ICD-10-CM | POA: Diagnosis not present

## 2013-01-26 DIAGNOSIS — Z90710 Acquired absence of both cervix and uterus: Secondary | ICD-10-CM | POA: Diagnosis not present

## 2013-01-26 DIAGNOSIS — E785 Hyperlipidemia, unspecified: Secondary | ICD-10-CM | POA: Diagnosis not present

## 2013-01-26 DIAGNOSIS — Z9221 Personal history of antineoplastic chemotherapy: Secondary | ICD-10-CM | POA: Diagnosis not present

## 2013-01-26 DIAGNOSIS — I10 Essential (primary) hypertension: Secondary | ICD-10-CM | POA: Diagnosis not present

## 2013-01-26 DIAGNOSIS — Z171 Estrogen receptor negative status [ER-]: Secondary | ICD-10-CM | POA: Diagnosis not present

## 2013-01-26 DIAGNOSIS — Z7982 Long term (current) use of aspirin: Secondary | ICD-10-CM | POA: Diagnosis not present

## 2013-01-26 DIAGNOSIS — F411 Generalized anxiety disorder: Secondary | ICD-10-CM | POA: Diagnosis not present

## 2013-01-26 DIAGNOSIS — Z853 Personal history of malignant neoplasm of breast: Secondary | ICD-10-CM | POA: Diagnosis not present

## 2013-01-26 LAB — CBC CANCER CENTER
Basophil #: 0 x10 3/mm (ref 0.0–0.1)
Basophil %: 0.5 %
HCT: 43.3 % (ref 35.0–47.0)
Lymphocyte %: 27.3 %
MCH: 31.7 pg (ref 26.0–34.0)
MCHC: 33.7 g/dL (ref 32.0–36.0)
MCV: 94 fL (ref 80–100)
Monocyte #: 0.5 x10 3/mm (ref 0.2–0.9)
Monocyte %: 9.3 %
Neutrophil #: 3.3 x10 3/mm (ref 1.4–6.5)
Neutrophil %: 62.1 %
RBC: 4.6 10*6/uL (ref 3.80–5.20)
RDW: 12.8 % (ref 11.5–14.5)
WBC: 5.4 x10 3/mm (ref 3.6–11.0)

## 2013-01-26 LAB — COMPREHENSIVE METABOLIC PANEL
Albumin: 3.8 g/dL (ref 3.4–5.0)
Anion Gap: 5 — ABNORMAL LOW (ref 7–16)
Bilirubin,Total: 0.4 mg/dL (ref 0.2–1.0)
Calcium, Total: 9.6 mg/dL (ref 8.5–10.1)
Co2: 32 mmol/L (ref 21–32)
EGFR (Non-African Amer.): >60
Glucose: 96 mg/dL (ref 65–99)
Osmolality: 280 (ref 275–301)
Potassium: 3.7 mmol/L (ref 3.5–5.1)
SGOT(AST): 28 U/L (ref 15–37)
Sodium: 140 mmol/L (ref 136–145)
Total Protein: 7.4 g/dL (ref 6.4–8.2)

## 2013-01-27 LAB — CANCER ANTIGEN 27.29: CA 27.29: 23.8 U/mL (ref 0.0–38.6)

## 2013-02-08 DIAGNOSIS — I10 Essential (primary) hypertension: Secondary | ICD-10-CM | POA: Diagnosis not present

## 2013-02-08 DIAGNOSIS — R109 Unspecified abdominal pain: Secondary | ICD-10-CM | POA: Diagnosis not present

## 2013-02-08 DIAGNOSIS — R5381 Other malaise: Secondary | ICD-10-CM | POA: Diagnosis not present

## 2013-02-08 DIAGNOSIS — Z0000 Encounter for general adult medical examination without abnormal findings: Secondary | ICD-10-CM | POA: Diagnosis not present

## 2013-02-08 DIAGNOSIS — R319 Hematuria, unspecified: Secondary | ICD-10-CM | POA: Diagnosis not present

## 2013-02-08 DIAGNOSIS — Z23 Encounter for immunization: Secondary | ICD-10-CM | POA: Diagnosis not present

## 2013-02-08 DIAGNOSIS — E785 Hyperlipidemia, unspecified: Secondary | ICD-10-CM | POA: Diagnosis not present

## 2013-02-24 ENCOUNTER — Ambulatory Visit: Payer: Self-pay | Admitting: Oncology

## 2013-03-09 DIAGNOSIS — R109 Unspecified abdominal pain: Secondary | ICD-10-CM | POA: Diagnosis not present

## 2013-03-09 DIAGNOSIS — E785 Hyperlipidemia, unspecified: Secondary | ICD-10-CM | POA: Diagnosis not present

## 2013-03-09 DIAGNOSIS — Z0000 Encounter for general adult medical examination without abnormal findings: Secondary | ICD-10-CM | POA: Diagnosis not present

## 2013-03-09 DIAGNOSIS — R5383 Other fatigue: Secondary | ICD-10-CM | POA: Diagnosis not present

## 2013-03-09 DIAGNOSIS — I10 Essential (primary) hypertension: Secondary | ICD-10-CM | POA: Diagnosis not present

## 2013-03-09 DIAGNOSIS — R5381 Other malaise: Secondary | ICD-10-CM | POA: Diagnosis not present

## 2013-03-09 DIAGNOSIS — Z23 Encounter for immunization: Secondary | ICD-10-CM | POA: Diagnosis not present

## 2013-04-18 IMAGING — CT CT CHEST W/ CM
1 of 2 series · 14 of 29 positions shown, 18 images · IV contrast (agent unspecified)
Comparison: 05/07/2010 and 08/04/2009

REASON FOR EXAM: breast CA Hx abn CT scan
COMMENTS:

PROCEDURE:     KCT - KCT CHEST WITH CONTRAST  - December 23, 2010  [DATE]
RESULT:     Indication: Breast can't
TECHNIQUE: Multiple axial images of the chest are obtained with 75 mL of
[id number] intravenous contrast.

[Series 2: chest w/ 5.0 i41f 3 · axial · 0.70mm/px · z∈[-94,+186]mm · 14 of 66 slices shown, 18 images]
[im 5/66  mediastinal]
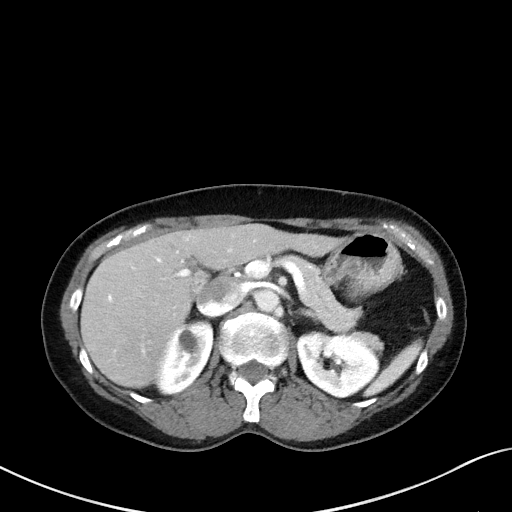
[im 5/66  lung]
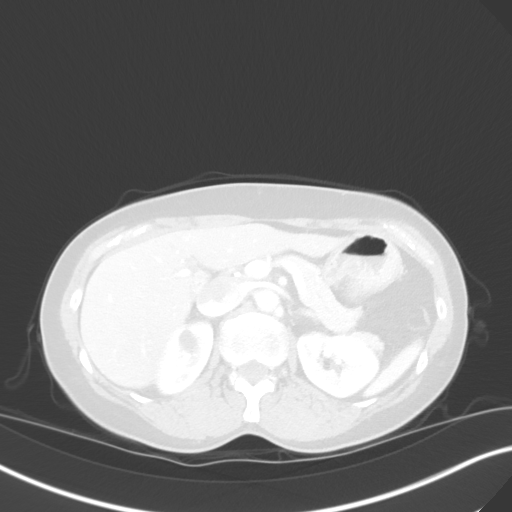
[im 10/66  lung]
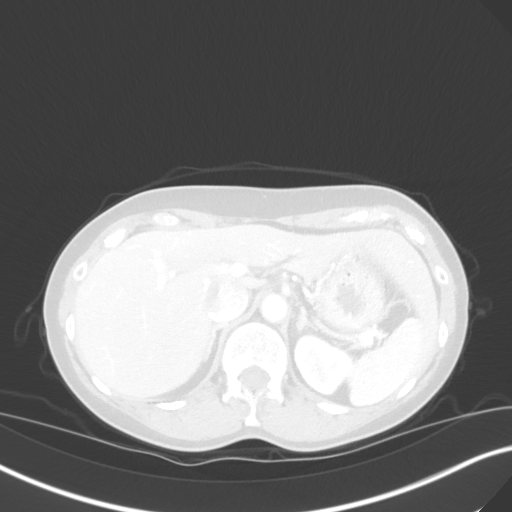
[im 14/66  lung]
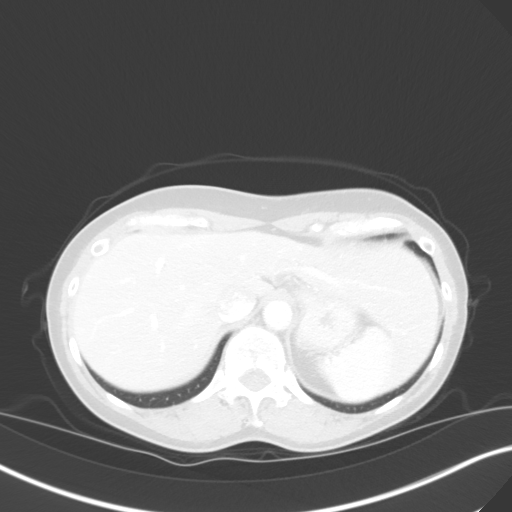
[im 19/66  lung]
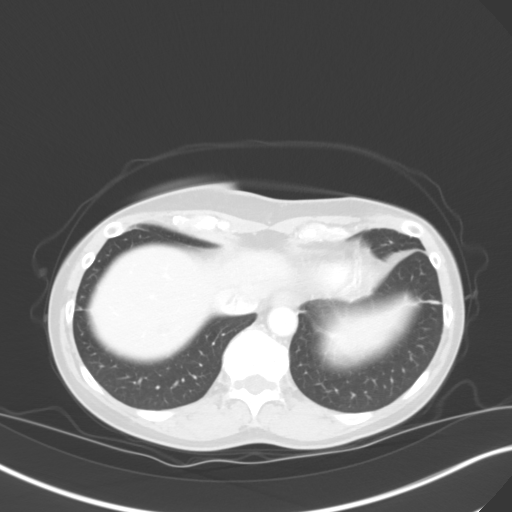
[im 24/66  mediastinal]
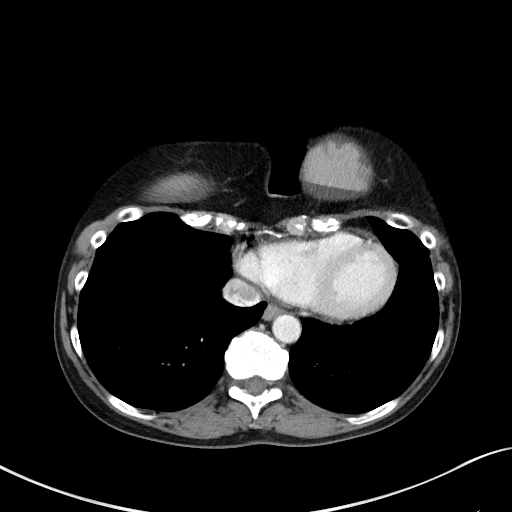
[im 24/66  lung]
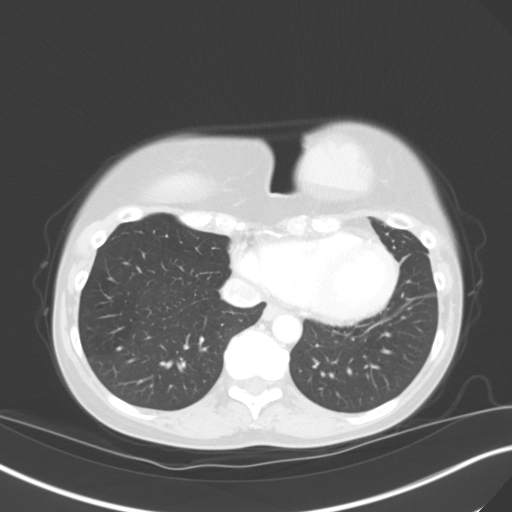
[im 28/66  lung]
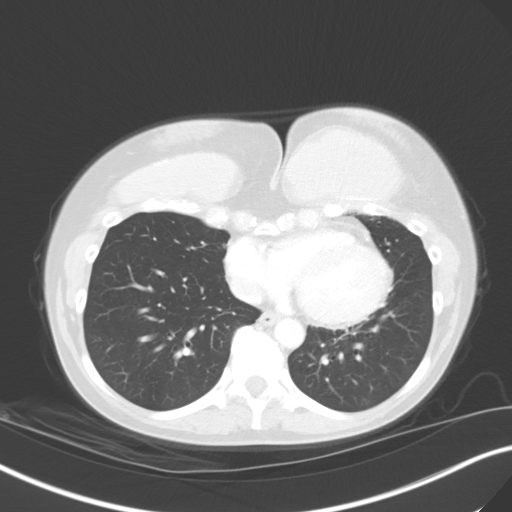
[im 32/66  lung]
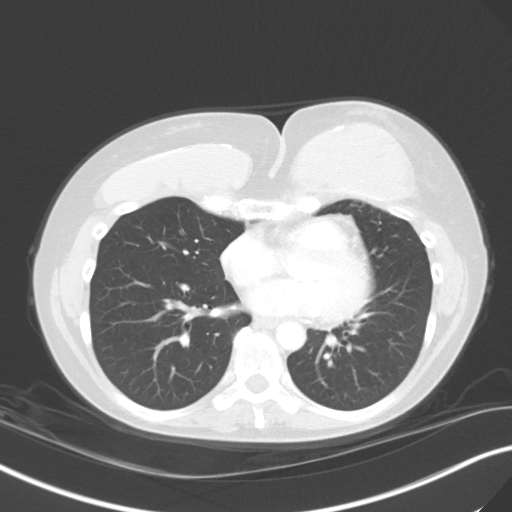
[im 33/66  lung]
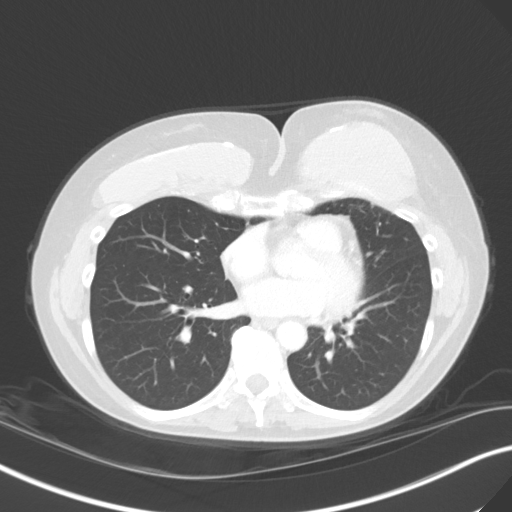
[im 38/66  mediastinal]
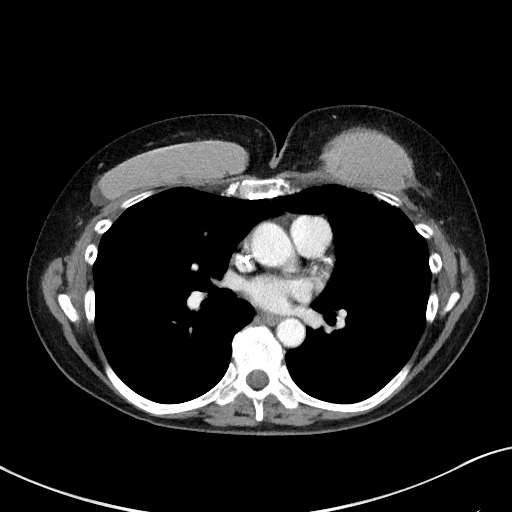
[im 38/66  lung]
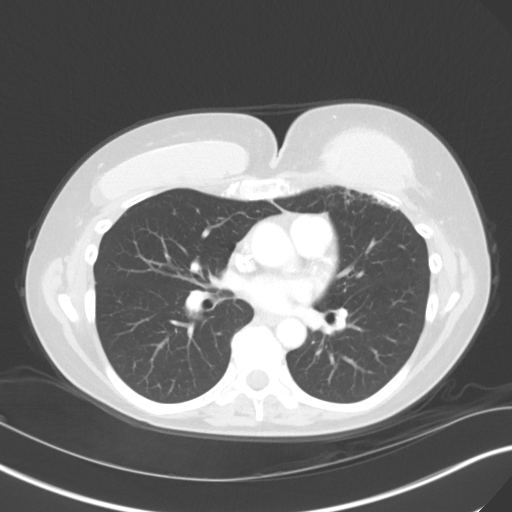
[im 42/66  lung]
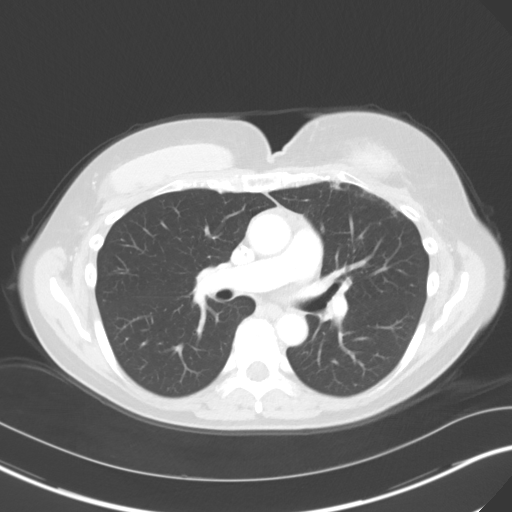
[im 47/66  lung]
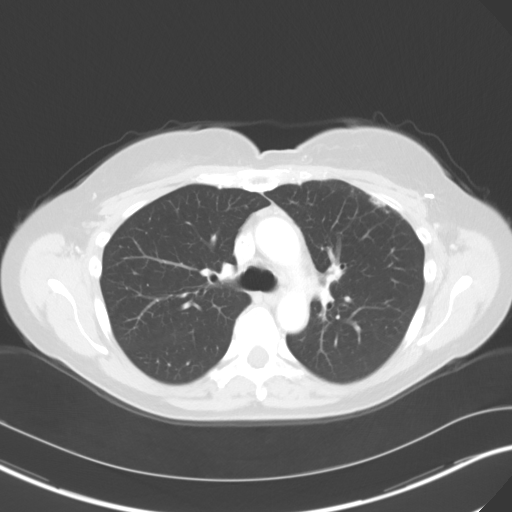
[im 52/66  lung]
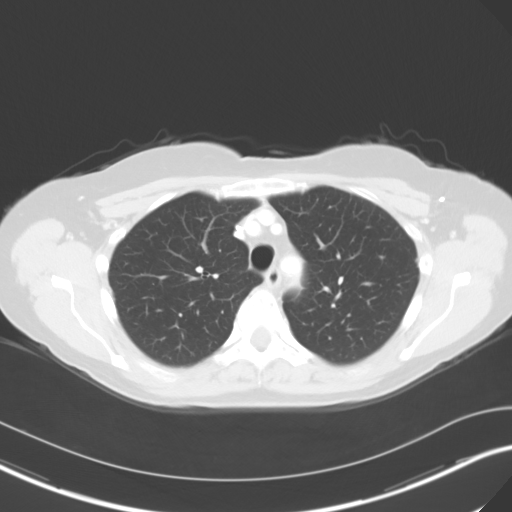
[im 56/66  mediastinal]
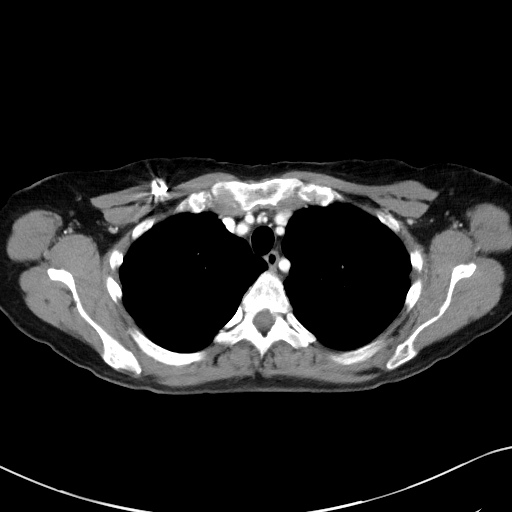
[im 56/66  lung]
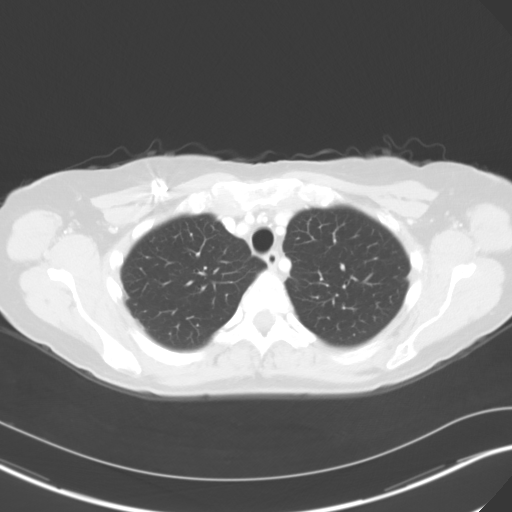
[im 61/66  lung]
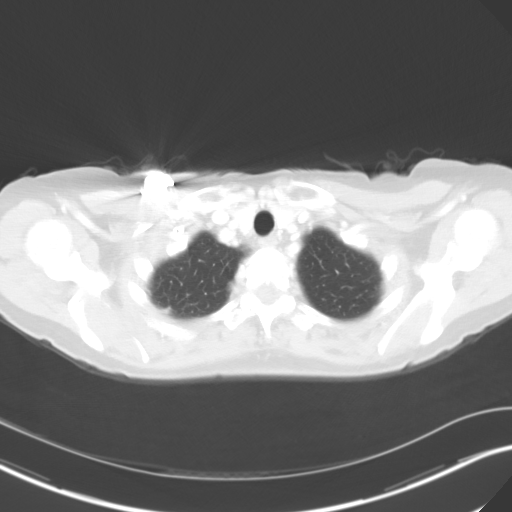

[14 of 29 positions shown; findings below may reference images not displayed]

FINDINGS: The central airways are patent. There is minimal left basilar fibrosis.
There is subpleural interstitial thickening involving the anterior segment
of the left upper lobe unchanged compared with 05/07/2010 likely representing
chronic interstitial change. This may be related to prior radiation. There
is no focal consolidation. There is no pleural effusion or pneumothorax.

There bilateral breast implants noted.

There are no pathologically enlarged axillary, hilar, or mediastinal lymph
nodes.

The heart size is normal. There is no pericardial effusion. The thoracic
aorta is normal in caliber.

Review of bone windows demonstrates no focal lytic or sclerotic lesions.

Limited noncontrast images of the upper abdomen were obtained. The adrenal
glands appear normal. The remainder of the visualized abdominal organs are
unremarkable.
IMPRESSION: 1. No acute disease of the chest.

## 2013-05-11 DIAGNOSIS — Z0000 Encounter for general adult medical examination without abnormal findings: Secondary | ICD-10-CM | POA: Diagnosis not present

## 2013-05-11 DIAGNOSIS — R109 Unspecified abdominal pain: Secondary | ICD-10-CM | POA: Diagnosis not present

## 2013-05-11 DIAGNOSIS — I10 Essential (primary) hypertension: Secondary | ICD-10-CM | POA: Diagnosis not present

## 2013-05-11 DIAGNOSIS — R5381 Other malaise: Secondary | ICD-10-CM | POA: Diagnosis not present

## 2013-05-11 DIAGNOSIS — Z23 Encounter for immunization: Secondary | ICD-10-CM | POA: Diagnosis not present

## 2013-05-11 DIAGNOSIS — C50919 Malignant neoplasm of unspecified site of unspecified female breast: Secondary | ICD-10-CM | POA: Diagnosis not present

## 2013-05-11 DIAGNOSIS — R5383 Other fatigue: Secondary | ICD-10-CM | POA: Diagnosis not present

## 2013-08-12 DIAGNOSIS — D235 Other benign neoplasm of skin of trunk: Secondary | ICD-10-CM | POA: Diagnosis not present

## 2013-08-12 DIAGNOSIS — Z85828 Personal history of other malignant neoplasm of skin: Secondary | ICD-10-CM | POA: Diagnosis not present

## 2013-11-10 DIAGNOSIS — E7800 Pure hypercholesterolemia, unspecified: Secondary | ICD-10-CM | POA: Diagnosis not present

## 2013-11-10 DIAGNOSIS — C50919 Malignant neoplasm of unspecified site of unspecified female breast: Secondary | ICD-10-CM | POA: Diagnosis not present

## 2013-11-10 DIAGNOSIS — I10 Essential (primary) hypertension: Secondary | ICD-10-CM | POA: Diagnosis not present

## 2013-11-10 DIAGNOSIS — M129 Arthropathy, unspecified: Secondary | ICD-10-CM | POA: Diagnosis not present

## 2013-11-10 DIAGNOSIS — R5383 Other fatigue: Secondary | ICD-10-CM | POA: Diagnosis not present

## 2013-11-10 DIAGNOSIS — Z23 Encounter for immunization: Secondary | ICD-10-CM | POA: Diagnosis not present

## 2013-11-10 DIAGNOSIS — R5381 Other malaise: Secondary | ICD-10-CM | POA: Diagnosis not present

## 2013-12-07 DIAGNOSIS — Z853 Personal history of malignant neoplasm of breast: Secondary | ICD-10-CM | POA: Diagnosis not present

## 2013-12-07 DIAGNOSIS — R928 Other abnormal and inconclusive findings on diagnostic imaging of breast: Secondary | ICD-10-CM | POA: Diagnosis not present

## 2013-12-13 DIAGNOSIS — Z23 Encounter for immunization: Secondary | ICD-10-CM | POA: Diagnosis not present

## 2013-12-26 ENCOUNTER — Encounter: Payer: Self-pay | Admitting: General Surgery

## 2014-01-25 ENCOUNTER — Ambulatory Visit: Payer: Self-pay | Admitting: Oncology

## 2014-01-25 DIAGNOSIS — R5383 Other fatigue: Secondary | ICD-10-CM | POA: Diagnosis not present

## 2014-01-25 DIAGNOSIS — Z853 Personal history of malignant neoplasm of breast: Secondary | ICD-10-CM | POA: Diagnosis not present

## 2014-01-25 DIAGNOSIS — Z85828 Personal history of other malignant neoplasm of skin: Secondary | ICD-10-CM | POA: Diagnosis not present

## 2014-01-25 DIAGNOSIS — Z79899 Other long term (current) drug therapy: Secondary | ICD-10-CM | POA: Diagnosis not present

## 2014-01-25 DIAGNOSIS — Z90722 Acquired absence of ovaries, bilateral: Secondary | ICD-10-CM | POA: Diagnosis not present

## 2014-01-25 DIAGNOSIS — Z90710 Acquired absence of both cervix and uterus: Secondary | ICD-10-CM | POA: Diagnosis not present

## 2014-01-25 DIAGNOSIS — R531 Weakness: Secondary | ICD-10-CM | POA: Diagnosis not present

## 2014-01-25 DIAGNOSIS — Z923 Personal history of irradiation: Secondary | ICD-10-CM | POA: Diagnosis not present

## 2014-01-25 DIAGNOSIS — Z171 Estrogen receptor negative status [ER-]: Secondary | ICD-10-CM | POA: Diagnosis not present

## 2014-01-25 DIAGNOSIS — E0500 Thyrotoxicosis with diffuse goiter without thyrotoxic crisis or storm: Secondary | ICD-10-CM | POA: Diagnosis not present

## 2014-01-25 DIAGNOSIS — E785 Hyperlipidemia, unspecified: Secondary | ICD-10-CM | POA: Diagnosis not present

## 2014-01-25 DIAGNOSIS — Z9221 Personal history of antineoplastic chemotherapy: Secondary | ICD-10-CM | POA: Diagnosis not present

## 2014-01-25 DIAGNOSIS — Z9012 Acquired absence of left breast and nipple: Secondary | ICD-10-CM | POA: Diagnosis not present

## 2014-01-25 DIAGNOSIS — M810 Age-related osteoporosis without current pathological fracture: Secondary | ICD-10-CM | POA: Diagnosis not present

## 2014-01-25 DIAGNOSIS — Z7982 Long term (current) use of aspirin: Secondary | ICD-10-CM | POA: Diagnosis not present

## 2014-01-26 LAB — CANCER ANTIGEN 27.29: CA 27.29: 26.7 U/mL (ref 0.0–38.6)

## 2014-02-08 DIAGNOSIS — E0500 Thyrotoxicosis with diffuse goiter without thyrotoxic crisis or storm: Secondary | ICD-10-CM | POA: Diagnosis not present

## 2014-02-08 DIAGNOSIS — I10 Essential (primary) hypertension: Secondary | ICD-10-CM | POA: Diagnosis not present

## 2014-02-08 DIAGNOSIS — C50919 Malignant neoplasm of unspecified site of unspecified female breast: Secondary | ICD-10-CM | POA: Diagnosis not present

## 2014-02-24 ENCOUNTER — Ambulatory Visit: Payer: Self-pay | Admitting: Oncology

## 2014-03-22 DIAGNOSIS — R410 Disorientation, unspecified: Secondary | ICD-10-CM | POA: Diagnosis not present

## 2014-03-22 DIAGNOSIS — I10 Essential (primary) hypertension: Secondary | ICD-10-CM | POA: Diagnosis not present

## 2014-03-22 DIAGNOSIS — B354 Tinea corporis: Secondary | ICD-10-CM | POA: Diagnosis not present

## 2014-05-24 DIAGNOSIS — R410 Disorientation, unspecified: Secondary | ICD-10-CM | POA: Diagnosis not present

## 2014-05-24 DIAGNOSIS — Z23 Encounter for immunization: Secondary | ICD-10-CM | POA: Diagnosis not present

## 2014-05-24 DIAGNOSIS — I10 Essential (primary) hypertension: Secondary | ICD-10-CM | POA: Diagnosis not present

## 2014-05-24 DIAGNOSIS — E780 Pure hypercholesterolemia: Secondary | ICD-10-CM | POA: Diagnosis not present

## 2014-08-11 DIAGNOSIS — D225 Melanocytic nevi of trunk: Secondary | ICD-10-CM | POA: Diagnosis not present

## 2014-08-11 DIAGNOSIS — Z85828 Personal history of other malignant neoplasm of skin: Secondary | ICD-10-CM | POA: Diagnosis not present

## 2014-08-11 DIAGNOSIS — D2262 Melanocytic nevi of left upper limb, including shoulder: Secondary | ICD-10-CM | POA: Diagnosis not present

## 2014-08-11 DIAGNOSIS — B360 Pityriasis versicolor: Secondary | ICD-10-CM | POA: Diagnosis not present

## 2014-09-06 DIAGNOSIS — G43109 Migraine with aura, not intractable, without status migrainosus: Secondary | ICD-10-CM | POA: Diagnosis not present

## 2014-11-22 ENCOUNTER — Ambulatory Visit (INDEPENDENT_AMBULATORY_CARE_PROVIDER_SITE_OTHER): Payer: Medicare Other | Admitting: Family Medicine

## 2014-11-22 ENCOUNTER — Encounter: Payer: Self-pay | Admitting: Family Medicine

## 2014-11-22 VITALS — BP 138/86 | HR 74 | Temp 98.3°F | Resp 16 | Wt 135.0 lb

## 2014-11-22 DIAGNOSIS — B354 Tinea corporis: Secondary | ICD-10-CM | POA: Insufficient documentation

## 2014-11-22 DIAGNOSIS — Z87442 Personal history of urinary calculi: Secondary | ICD-10-CM | POA: Insufficient documentation

## 2014-11-22 DIAGNOSIS — R413 Other amnesia: Secondary | ICD-10-CM | POA: Insufficient documentation

## 2014-11-22 DIAGNOSIS — R42 Dizziness and giddiness: Secondary | ICD-10-CM | POA: Insufficient documentation

## 2014-11-22 DIAGNOSIS — E0500 Thyrotoxicosis with diffuse goiter without thyrotoxic crisis or storm: Secondary | ICD-10-CM | POA: Insufficient documentation

## 2014-11-22 DIAGNOSIS — D1720 Benign lipomatous neoplasm of skin and subcutaneous tissue of unspecified limb: Secondary | ICD-10-CM | POA: Insufficient documentation

## 2014-11-22 DIAGNOSIS — N952 Postmenopausal atrophic vaginitis: Secondary | ICD-10-CM | POA: Insufficient documentation

## 2014-11-22 DIAGNOSIS — R599 Enlarged lymph nodes, unspecified: Secondary | ICD-10-CM | POA: Insufficient documentation

## 2014-11-22 DIAGNOSIS — G939 Disorder of brain, unspecified: Secondary | ICD-10-CM | POA: Insufficient documentation

## 2014-11-22 DIAGNOSIS — Z789 Other specified health status: Secondary | ICD-10-CM | POA: Insufficient documentation

## 2014-11-22 DIAGNOSIS — M19049 Primary osteoarthritis, unspecified hand: Secondary | ICD-10-CM | POA: Insufficient documentation

## 2014-11-22 DIAGNOSIS — R5383 Other fatigue: Secondary | ICD-10-CM | POA: Insufficient documentation

## 2014-11-22 DIAGNOSIS — I10 Essential (primary) hypertension: Secondary | ICD-10-CM | POA: Insufficient documentation

## 2014-11-22 DIAGNOSIS — C50919 Malignant neoplasm of unspecified site of unspecified female breast: Secondary | ICD-10-CM | POA: Insufficient documentation

## 2014-11-22 DIAGNOSIS — E8941 Symptomatic postprocedural ovarian failure: Secondary | ICD-10-CM | POA: Insufficient documentation

## 2014-11-22 DIAGNOSIS — R252 Cramp and spasm: Secondary | ICD-10-CM | POA: Insufficient documentation

## 2014-11-22 DIAGNOSIS — Z23 Encounter for immunization: Secondary | ICD-10-CM

## 2014-11-22 DIAGNOSIS — N390 Urinary tract infection, site not specified: Secondary | ICD-10-CM | POA: Insufficient documentation

## 2014-11-22 DIAGNOSIS — Z8619 Personal history of other infectious and parasitic diseases: Secondary | ICD-10-CM | POA: Insufficient documentation

## 2014-11-22 DIAGNOSIS — N3281 Overactive bladder: Secondary | ICD-10-CM | POA: Insufficient documentation

## 2014-11-22 DIAGNOSIS — E7800 Pure hypercholesterolemia, unspecified: Secondary | ICD-10-CM | POA: Insufficient documentation

## 2014-11-22 DIAGNOSIS — R221 Localized swelling, mass and lump, neck: Secondary | ICD-10-CM | POA: Insufficient documentation

## 2014-11-22 DIAGNOSIS — M810 Age-related osteoporosis without current pathological fracture: Secondary | ICD-10-CM | POA: Insufficient documentation

## 2014-11-22 DIAGNOSIS — I6782 Cerebral ischemia: Secondary | ICD-10-CM | POA: Insufficient documentation

## 2014-11-22 DIAGNOSIS — D649 Anemia, unspecified: Secondary | ICD-10-CM | POA: Insufficient documentation

## 2014-11-22 HISTORY — DX: Personal history of urinary calculi: Z87.442

## 2014-11-22 NOTE — Progress Notes (Signed)
[name]    DOB: October 19, 1945   69 y.o.   MRN: [id number] Visit Date: 11/22/2014  Today's Provider: Wilhemena Durie, MD   Chief Complaint  Patient presents with  . Hypertension    Follow uo   Subjective:    HPI  Hypertension, follow-up:  BP Readings from Last 3 Encounters:  11/22/14 138/86  09/16/12 120/80    She was last seen for hypertension 6 months ago.  BP at that visit was 128/74, checks the blood pressure usually in the morning after breakfast . She reports good compliance with treatment does the Metoprolol in half, cuts the tablet in half, usually takes it during lunch time. She is not having side effects, only gets side effect only if takes a whole pill. Which includes the numbness on her arm and the heart palpitations. She is not exercising. She is adherent to low salt diet or none  Outside blood pressures are 135/80, 100/70, usually the systolic are 294-765  . She experience hand tremors yesterday         Weight trend: fluctuating a bit. Wt Readings from Last 3 Encounters:  11/22/14 135 lb (61.236 kg)  09/16/12 126 lb (57.153 kg)    Current diet: in general, a "healthy" diet    ------------------------------------------------------------------------      Allergies  Allergen Reactions  . Statins   . Sulfa Antibiotics    Previous Medications   CALCIUM-VITAMIN D-VITAMIN K (CALCIUM + D + K PO)    Take 1 tablet by mouth daily.   CHOLECALCIFEROL (VITAMIN D) 2000 UNITS CAPS    Take by mouth.   COENZYME Q10 (CO Q10) 200 MG CAPS    Take by mouth.   DICLOFENAC SODIUM (VOLTAREN) 1 % GEL    Place onto the skin.   KRILL OIL 1000 MG CAPS    Take 1 capsule by mouth daily.   METOPROLOL SUCCINATE (TOPROL-XL) 25 MG 24 HR TABLET    1 (ONE) TABLET ER 24HR, ORAL, ONCE DAILY   MULTIPLE VITAMINS-MINERALS (MULTIVITAMIN WITH MINERALS) TABLET    Take 1 tablet by mouth daily.    Review of Systems  Constitutional: Negative.   HENT:  Negative.   Eyes: Negative.   Respiratory: Negative.   Cardiovascular: Negative.  Negative for palpitations.       Patient didn't have chest pain, but 2 months ago did have right arm numbness and patient thinks it came from the Metoprolol.   Gastrointestinal: Negative.   Endocrine: Negative.   Genitourinary: Negative.   Musculoskeletal: Negative.   Skin: Negative.   Allergic/Immunologic: Negative.   Neurological: Negative.   Hematological: Negative.   Psychiatric/Behavioral: Negative.     Social History  Substance Use Topics  . Smoking status: Never Smoker   . Smokeless tobacco: Never Used  . Alcohol Use: Yes     Comment: occassional   Objective:   BP 138/86 mmHg  Pulse 74  Temp(Src) 98.3 F (36.8 C) (Oral)  Resp 16  Wt 135 lb (61.236 kg)  Physical Exam  Constitutional: She is oriented to person, place, and time. She appears well-developed and well-nourished.  HENT:  Head: Normocephalic and atraumatic.  Right Ear: External ear normal.  Left Ear: External ear normal.  Eyes: Conjunctivae are normal.  Neck: Neck supple.  Cardiovascular: Normal rate, regular rhythm and normal heart sounds.   Pulmonary/Chest: Effort normal and breath sounds normal.  Abdominal: Soft.  Neurological: She is alert and oriented to person,  place, and time.  Skin: Skin is warm and dry.  Psychiatric: She has a normal mood and affect. Her behavior is normal. Judgment and thought content normal.        Assessment & Plan:     1. Essential (primary) hypertension  - CBC with Differential/Platelet - Lipid panel - TSH - Comprehensive metabolic panel  2. Need for influenza vaccination  - Flu vaccine HIGH DOSE PF (Fluzone High dose) 3. Breast cancer Followed by oncology     I have done the exam and reviewed the above chart and it is accurate to the best of my knowledge.  Richard Cranford Mon, MD  [facility]

## 2014-12-11 DIAGNOSIS — Z853 Personal history of malignant neoplasm of breast: Secondary | ICD-10-CM | POA: Diagnosis not present

## 2014-12-11 DIAGNOSIS — R921 Mammographic calcification found on diagnostic imaging of breast: Secondary | ICD-10-CM | POA: Diagnosis not present

## 2014-12-27 ENCOUNTER — Encounter: Payer: Self-pay | Admitting: Family Medicine

## 2015-01-23 ENCOUNTER — Other Ambulatory Visit: Payer: Self-pay | Admitting: *Deleted

## 2015-01-23 DIAGNOSIS — C50919 Malignant neoplasm of unspecified site of unspecified female breast: Secondary | ICD-10-CM

## 2015-01-29 ENCOUNTER — Inpatient Hospital Stay: Payer: Medicare Other

## 2015-01-29 ENCOUNTER — Inpatient Hospital Stay: Payer: Medicare Other | Admitting: Oncology

## 2015-02-28 ENCOUNTER — Encounter (INDEPENDENT_AMBULATORY_CARE_PROVIDER_SITE_OTHER): Payer: Self-pay

## 2015-02-28 ENCOUNTER — Inpatient Hospital Stay: Payer: Medicare Other | Attending: Oncology

## 2015-02-28 ENCOUNTER — Inpatient Hospital Stay (HOSPITAL_BASED_OUTPATIENT_CLINIC_OR_DEPARTMENT_OTHER): Payer: Medicare Other | Admitting: Oncology

## 2015-02-28 ENCOUNTER — Other Ambulatory Visit: Payer: Self-pay | Admitting: Family Medicine

## 2015-02-28 VITALS — BP 178/88 | HR 89 | Temp 97.5°F | Resp 18 | Wt 138.0 lb

## 2015-02-28 DIAGNOSIS — Z853 Personal history of malignant neoplasm of breast: Secondary | ICD-10-CM | POA: Diagnosis not present

## 2015-02-28 DIAGNOSIS — I10 Essential (primary) hypertension: Secondary | ICD-10-CM | POA: Diagnosis not present

## 2015-02-28 DIAGNOSIS — Z171 Estrogen receptor negative status [ER-]: Secondary | ICD-10-CM

## 2015-02-28 DIAGNOSIS — Z79899 Other long term (current) drug therapy: Secondary | ICD-10-CM | POA: Diagnosis not present

## 2015-02-28 DIAGNOSIS — M1990 Unspecified osteoarthritis, unspecified site: Secondary | ICD-10-CM | POA: Diagnosis not present

## 2015-02-28 DIAGNOSIS — C50919 Malignant neoplasm of unspecified site of unspecified female breast: Secondary | ICD-10-CM

## 2015-02-28 DIAGNOSIS — Z9221 Personal history of antineoplastic chemotherapy: Secondary | ICD-10-CM | POA: Diagnosis not present

## 2015-02-28 DIAGNOSIS — Z923 Personal history of irradiation: Secondary | ICD-10-CM | POA: Diagnosis not present

## 2015-02-28 DIAGNOSIS — C50212 Malignant neoplasm of upper-inner quadrant of left female breast: Secondary | ICD-10-CM

## 2015-02-28 LAB — CBC WITH DIFFERENTIAL/PLATELET
BASOS PCT: 1 %
Basophils Absolute: 0 10*3/uL (ref 0–0.1)
EOS ABS: 0 10*3/uL (ref 0–0.7)
EOS PCT: 1 %
HCT: 43.6 % (ref 35.0–47.0)
Hemoglobin: 14.7 g/dL (ref 12.0–16.0)
LYMPHS ABS: 1.4 10*3/uL (ref 1.0–3.6)
Lymphocytes Relative: 32 %
MCH: 30.7 pg (ref 26.0–34.0)
MCHC: 33.8 g/dL (ref 32.0–36.0)
MCV: 91 fL (ref 80.0–100.0)
MONOS PCT: 10 %
Monocytes Absolute: 0.4 10*3/uL (ref 0.2–0.9)
Neutro Abs: 2.5 10*3/uL (ref 1.4–6.5)
Neutrophils Relative %: 56 %
PLATELETS: 239 10*3/uL (ref 150–440)
RBC: 4.79 MIL/uL (ref 3.80–5.20)
RDW: 12.9 % (ref 11.5–14.5)
WBC: 4.4 10*3/uL (ref 3.6–11.0)

## 2015-02-28 LAB — COMPREHENSIVE METABOLIC PANEL
ALT: 20 U/L (ref 14–54)
ANION GAP: 7 (ref 5–15)
AST: 27 U/L (ref 15–41)
Albumin: 4.4 g/dL (ref 3.5–5.0)
Alkaline Phosphatase: 73 U/L (ref 38–126)
BUN: 13 mg/dL (ref 6–20)
CALCIUM: 9.2 mg/dL (ref 8.9–10.3)
CHLORIDE: 102 mmol/L (ref 101–111)
CO2: 26 mmol/L (ref 22–32)
Creatinine, Ser: 0.8 mg/dL (ref 0.44–1.00)
GFR calc non Af Amer: >60 mL/min (ref >60)
Glucose, Bld: 100 mg/dL — ABNORMAL HIGH (ref 65–99)
POTASSIUM: 3.7 mmol/L (ref 3.5–5.1)
SODIUM: 135 mmol/L (ref 135–145)
Total Bilirubin: 0.7 mg/dL (ref 0.3–1.2)
Total Protein: 7.6 g/dL (ref 6.5–8.1)

## 2015-02-28 NOTE — Progress Notes (Signed)
[facility]  [phone]  Fax:([phone]     Anna Mcmillan DOB: [birth year]  MR#: [id number]  VQQ#:[id number]  Patient Care Team: Jerrol Banana., MD as PCP - General (Family Medicine) Robert Bellow, MD (General Surgery) Jerrol Banana., MD (Family Medicine)  CHIEF COMPLAINT:  Chief Complaint  Patient presents with  . Breast Cancer    INTERVAL HISTORY:  Patient is here for further follow up regarding carcinoma of left breast. Diagnosed in January 2011, stage I, T1b N0 M0, triple negative disease. She reports no new complaints, illnesses, or hospital visits since her last evaluation in December 2015. She is followed closely by Dr. [name] her PCP. She most recently had a mammogram in October 2016 that was reported as [facility]. She overall is feeling great and denies any compliaints.   REVIEW OF SYSTEMS:   Review of Systems  Constitutional: Negative for fever, chills, weight loss, malaise/fatigue and diaphoresis.  HENT: Negative.   Eyes: Negative.   Respiratory: Negative for cough, hemoptysis, sputum production, shortness of breath and wheezing.   Cardiovascular: Negative for chest pain, palpitations, orthopnea, claudication, leg swelling and PND.  Gastrointestinal: Negative for heartburn, nausea, vomiting, abdominal pain, diarrhea, constipation, blood in stool and melena.  Genitourinary: Negative.   Musculoskeletal: Negative.   Skin: Negative.   Neurological: Negative for dizziness, tingling, focal weakness, seizures and weakness.  Endo/Heme/Allergies: Does not bruise/bleed easily.  Psychiatric/Behavioral: Negative for depression. The patient is not nervous/anxious and does not have insomnia.     As per HPI. Otherwise, a complete review of systems is negatve.  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 03-21-09  Echocardiogram LVEF 60 % is performed at [facility] 04/04/09- 05/15/09: AC dose dense X 4 cycles 06/05/09 - 09/03/09 taxol weekly X 12 09/24/09- 11/01/09: site treated left breast Tumor dose 5040 cGy in 28 fractions      Breast CA ([place])   03/21/2009 Initial Diagnosis Breast CA ([place])    PAST MEDICAL HISTORY: Past Medical History  Diagnosis Date  . Cancer 2010    left breast   . Hypertension 1984  . Arthritis   . Thyroid disease   . Pulmonary fibrosis 2012  . Graves disease   . Graves disease 1996    PAST SURGICAL HISTORY: Past Surgical History  Procedure Laterality Date  . Abdominal hysterectomy  2001  . Colonoscopy  2002  . Breast surgery Left 2002    lumpectomy,   . Breast biopsy Left 2002  . Cosmetic surgery Left     implant 1977    FAMILY HISTORY Family History  Problem Relation Age of Onset  . Kidney disease Mother     Kidney failure/ received a kidney transplant when she was 86    GYNECOLOGIC HISTORY:  No LMP recorded. Patient has had a hysterectomy.     ADVANCED DIRECTIVES:    HEALTH MAINTENANCE: Social History  Substance Use Topics  . Smoking status: Never Smoker   . Smokeless tobacco: Never Used  . Alcohol Use: Yes     Comment: occassional     Colonoscopy:  PAP:  Bone density:  Lipid panel:  Allergies  Allergen Reactions  . Statins   . Sulfa Antibiotics     Current Outpatient Prescriptions  Medication Sig Dispense Refill  . Calcium-Vitamin D-Vitamin K (CALCIUM + D + K PO) Take 1 tablet by  mouth daily.    . Cholecalciferol (VITAMIN D) 2000 UNITS CAPS Take by mouth.    . Coenzyme Q10 (CO Q10) 200 MG CAPS Take by mouth.    Javier Docker Oil 1000 MG CAPS Take 1 capsule by mouth daily.    . Multiple Vitamins-Minerals (MULTIVITAMIN WITH MINERALS) tablet Take 1 tablet by mouth daily.    . metoprolol succinate (TOPROL-XL) 25 MG 24 hr tablet 1 (ONE) TABLET ER 24HR, ORAL, ONCE DAILY 30 tablet 4   No current facility-administered medications for  this visit.    OBJECTIVE: BP 178/88 mmHg  Pulse 89  Temp(Src) 97.5 F (36.4 C) (Tympanic)  Resp 18  Wt 138 lb 0.1 oz (62.6 kg)   Body mass index is 23.68 kg/(m^2).    ECOG FS:0 - Asymptomatic  General: Well-developed, well-nourished, no acute distress. Eyes: Pink conjunctiva, anicteric sclera. HEENT: Normocephalic, moist mucous membranes, clear oropharnyx. Lungs: Clear to auscultation bilaterally. Heart: Regular rate and rhythm. No rubs, murmurs, or gallops. Abdomen: Soft, nontender, nondistended. No organomegaly noted, normoactive bowel sounds. Breast: Breast palpated in a circular manner in the sitting and supine positions.  No masses or fullness palpated.  Axilla palpated in both positions with no masses or fullness palpated. Left breast with scar tissue. Musculoskeletal: No edema, cyanosis, or clubbing. Neuro: Alert, answering all questions appropriately. Cranial nerves grossly intact. Skin: No rashes or petechiae noted. Psych: Normal affect. Lymphatics: No cervical, calvicular, axillary or inguinal LAD.   LAB RESULTS:  Appointment on 02/28/2015  Component Date Value Ref Range Status  . WBC 02/28/2015 4.4  3.6 - 11.0 K/uL Final  . RBC 02/28/2015 4.79  3.80 - 5.20 MIL/uL Final  . Hemoglobin 02/28/2015 14.7  12.0 - 16.0 g/dL Final  . HCT 02/28/2015 43.6  35.0 - 47.0 % Final  . MCV 02/28/2015 91.0  80.0 - 100.0 fL Final  . MCH 02/28/2015 30.7  26.0 - 34.0 pg Final  . MCHC 02/28/2015 33.8  32.0 - 36.0 g/dL Final  . RDW 02/28/2015 12.9  11.5 - 14.5 % Final  . Platelets 02/28/2015 239  150 - 440 K/uL Final  . Neutrophils Relative % 02/28/2015 56   Final  . Neutro Abs 02/28/2015 2.5  1.4 - 6.5 K/uL Final  . Lymphocytes Relative 02/28/2015 32   Final  . Lymphs Abs 02/28/2015 1.4  1.0 - 3.6 K/uL Final  . Monocytes Relative 02/28/2015 10   Final  . Monocytes Absolute 02/28/2015 0.4  0.2 - 0.9 K/uL Final  . Eosinophils Relative 02/28/2015 1   Final  . Eosinophils Absolute  02/28/2015 0.0  0 - 0.7 K/uL Final  . Basophils Relative 02/28/2015 1   Final  . Basophils Absolute 02/28/2015 0.0  0 - 0.1 K/uL Final  . Sodium 02/28/2015 135  135 - 145 mmol/L Final  . Potassium 02/28/2015 3.7  3.5 - 5.1 mmol/L Final  . Chloride 02/28/2015 102  101 - 111 mmol/L Final  . CO2 02/28/2015 26  22 - 32 mmol/L Final  . Glucose, Bld 02/28/2015 100* 65 - 99 mg/dL Final  . BUN 02/28/2015 13  6 - 20 mg/dL Final  . Creatinine, Ser 02/28/2015 0.80  0.44 - 1.00 mg/dL Final  . Calcium 02/28/2015 9.2  8.9 - 10.3 mg/dL Final  . Total Protein 02/28/2015 7.6  6.5 - 8.1 g/dL Final  . Albumin 02/28/2015 4.4  3.5 - 5.0 g/dL Final  . AST 02/28/2015 27  15 - 41 U/L Final  . ALT 02/28/2015 20  14 -  54 U/L Final  . Alkaline Phosphatase 02/28/2015 73  38 - 126 U/L Final  . Total Bilirubin 02/28/2015 0.7  0.3 - 1.2 mg/dL Final  . GFR calc non Af Amer 02/28/2015 >60  >60 mL/min Final  . GFR calc Af Amer 02/28/2015 >60  >60 mL/min Final   Comment: (NOTE) The eGFR has been calculated using the CKD EPI equation. This calculation has not been validated in all clinical situations. eGFR's persistently <60 mL/min signify possible Chronic Kidney Disease.   . Anion gap 02/28/2015 7  5 - 15 Final    STUDIES: No results found.  ASSESSMENT:  Left breast carcinoma.  PLAN:   1. Left breast cancer. Invasive ductal carcinoma, Stage I, TIb N0 M0, triple negative disease. Patient is status post left lumpectomy, AC x 4 and Taxol x 12. Also received XRT. Clinically there is no evidence of recurrent disease. Most recent mammogram was performed at outpatient facility and reported as negative, [facility] 1.   Will continue with follow up in 1 year.   Patient expressed understanding and was in agreement with this plan. She also understands that She can call clinic at any time with any questions, concerns, or complaints.   Dr. Oliva Bustard was available for consultation and review of plan of care for this  patient.   Evlyn Kanner, NP   02/28/2015 4:58 PM

## 2015-04-22 ENCOUNTER — Emergency Department: Payer: Medicare Other

## 2015-04-22 ENCOUNTER — Inpatient Hospital Stay
Admission: EM | Admit: 2015-04-22 | Discharge: 2015-04-24 | DRG: 603 | Disposition: A | Discharge status: Home / Self Care | Payer: Medicare Other | Attending: Internal Medicine | Admitting: Internal Medicine

## 2015-04-22 ENCOUNTER — Encounter: Payer: Self-pay | Admitting: *Deleted

## 2015-04-22 DIAGNOSIS — Z79899 Other long term (current) drug therapy: Secondary | ICD-10-CM | POA: Diagnosis not present

## 2015-04-22 DIAGNOSIS — L03119 Cellulitis of unspecified part of limb: Secondary | ICD-10-CM | POA: Diagnosis not present

## 2015-04-22 DIAGNOSIS — E0500 Thyrotoxicosis with diffuse goiter without thyrotoxic crisis or storm: Secondary | ICD-10-CM | POA: Diagnosis present

## 2015-04-22 DIAGNOSIS — J8410 Pulmonary fibrosis, unspecified: Secondary | ICD-10-CM | POA: Diagnosis present

## 2015-04-22 DIAGNOSIS — L03011 Cellulitis of right finger: Secondary | ICD-10-CM | POA: Diagnosis not present

## 2015-04-22 DIAGNOSIS — Z882 Allergy status to sulfonamides status: Secondary | ICD-10-CM | POA: Diagnosis not present

## 2015-04-22 DIAGNOSIS — I10 Essential (primary) hypertension: Secondary | ICD-10-CM | POA: Diagnosis present

## 2015-04-22 DIAGNOSIS — W5501XA Bitten by cat, initial encounter: Secondary | ICD-10-CM

## 2015-04-22 DIAGNOSIS — A280 Pasteurellosis: Secondary | ICD-10-CM | POA: Diagnosis not present

## 2015-04-22 DIAGNOSIS — Z853 Personal history of malignant neoplasm of breast: Secondary | ICD-10-CM | POA: Diagnosis not present

## 2015-04-22 DIAGNOSIS — M810 Age-related osteoporosis without current pathological fracture: Secondary | ICD-10-CM | POA: Diagnosis not present

## 2015-04-22 DIAGNOSIS — M659 Synovitis and tenosynovitis, unspecified: Secondary | ICD-10-CM

## 2015-04-22 DIAGNOSIS — Z841 Family history of disorders of kidney and ureter: Secondary | ICD-10-CM

## 2015-04-22 DIAGNOSIS — L0390 Cellulitis, unspecified: Secondary | ICD-10-CM | POA: Diagnosis present

## 2015-04-22 DIAGNOSIS — M65841 Other synovitis and tenosynovitis, right hand: Secondary | ICD-10-CM | POA: Diagnosis not present

## 2015-04-22 DIAGNOSIS — M7989 Other specified soft tissue disorders: Secondary | ICD-10-CM | POA: Diagnosis not present

## 2015-04-22 LAB — CBC WITH DIFFERENTIAL/PLATELET
BASOS PCT: 1 %
Basophils Absolute: 0.1 10*3/uL (ref 0–0.1)
EOS PCT: 0 %
Eosinophils Absolute: 0 10*3/uL (ref 0–0.7)
HEMATOCRIT: 43.2 % (ref 35.0–47.0)
Hemoglobin: 15.1 g/dL (ref 12.0–16.0)
Lymphocytes Relative: 18 %
Lymphs Abs: 1.9 10*3/uL (ref 1.0–3.6)
MCH: 30.9 pg (ref 26.0–34.0)
MCHC: 34.9 g/dL (ref 32.0–36.0)
MCV: 88.7 fL (ref 80.0–100.0)
MONO ABS: 0.8 10*3/uL (ref 0.2–0.9)
MONOS PCT: 8 %
NEUTROS ABS: 7.9 10*3/uL — AB (ref 1.4–6.5)
Neutrophils Relative %: 73 %
PLATELETS: 217 10*3/uL (ref 150–440)
RBC: 4.87 MIL/uL (ref 3.80–5.20)
RDW: 13.3 % (ref 11.5–14.5)
WBC: 10.7 10*3/uL (ref 3.6–11.0)

## 2015-04-22 LAB — COMPREHENSIVE METABOLIC PANEL
ALBUMIN: 4.5 g/dL (ref 3.5–5.0)
ALT: 22 U/L (ref 14–54)
ANION GAP: 12 (ref 5–15)
AST: 28 U/L (ref 15–41)
Alkaline Phosphatase: 71 U/L (ref 38–126)
BILIRUBIN TOTAL: 0.8 mg/dL (ref 0.3–1.2)
BUN: 9 mg/dL (ref 6–20)
CHLORIDE: 105 mmol/L (ref 101–111)
CO2: 22 mmol/L (ref 22–32)
Calcium: 9.3 mg/dL (ref 8.9–10.3)
Creatinine, Ser: 0.58 mg/dL (ref 0.44–1.00)
GFR calc Af Amer: >60 mL/min (ref >60)
GLUCOSE: 107 mg/dL — AB (ref 65–99)
POTASSIUM: 3.5 mmol/L (ref 3.5–5.1)
Sodium: 139 mmol/L (ref 135–145)
TOTAL PROTEIN: 7.9 g/dL (ref 6.5–8.1)

## 2015-04-22 MED ORDER — SODIUM CHLORIDE 0.9 % IV SOLN
INTRAVENOUS | Status: AC
  Administered 2015-04-22: 3 g via INTRAVENOUS
  Filled 2015-04-22: qty 3

## 2015-04-22 MED ORDER — METOPROLOL SUCCINATE ER 25 MG PO TB24
12.5000 mg | ORAL_TABLET | Freq: Every day | ORAL | Status: DC
  Administered 2015-04-23 – 2015-04-24 (×2): 12.5 mg via ORAL
  Filled 2015-04-22 (×2): qty 1

## 2015-04-22 MED ORDER — PIPERACILLIN-TAZOBACTAM 3.375 G IVPB
3.3750 g | Freq: Once | INTRAVENOUS | Status: AC
  Administered 2015-04-22: 3.375 g via INTRAVENOUS
  Filled 2015-04-22 (×2): qty 50

## 2015-04-22 MED ORDER — SODIUM CHLORIDE 0.9 % IV SOLN
3.0000 g | Freq: Four times a day (QID) | INTRAVENOUS | Status: DC
  Administered 2015-04-22 – 2015-04-24 (×7): 3 g via INTRAVENOUS
  Filled 2015-04-22 (×9): qty 3

## 2015-04-22 MED ORDER — OXYCODONE HCL 5 MG PO TABS: 5.0000 mg | ORAL_TABLET | ORAL | Status: DC | PRN

## 2015-04-22 MED ORDER — MORPHINE SULFATE (PF) 2 MG/ML IV SOLN: 2.0000 mg | INTRAVENOUS | Status: DC | PRN

## 2015-04-22 MED ORDER — CALCIUM CARBONATE-VITAMIN D 500-200 MG-UNIT PO TABS
ORAL_TABLET | Freq: Every day | ORAL | Status: DC
  Administered 2015-04-23 – 2015-04-24 (×2): 1 via ORAL
  Filled 2015-04-22 (×2): qty 1

## 2015-04-22 MED ORDER — VANCOMYCIN HCL IN DEXTROSE 1-5 GM/200ML-% IV SOLN
1000.0000 mg | Freq: Once | INTRAVENOUS | Status: AC
  Administered 2015-04-22: 1000 mg via INTRAVENOUS
  Filled 2015-04-22 (×2): qty 200

## 2015-04-22 MED ORDER — ACETAMINOPHEN 325 MG PO TABS
650.0000 mg | ORAL_TABLET | Freq: Four times a day (QID) | ORAL | Status: DC | PRN
  Administered 2015-04-22 – 2015-04-24 (×2): 650 mg via ORAL
  Filled 2015-04-22 (×2): qty 2

## 2015-04-22 MED ORDER — ADULT MULTIVITAMIN W/MINERALS CH
1.0000 | ORAL_TABLET | Freq: Every day | ORAL | Status: DC
  Administered 2015-04-22 – 2015-04-24 (×3): 1 via ORAL
  Filled 2015-04-22 (×4): qty 1

## 2015-04-22 MED ORDER — KRILL OIL 1000 MG PO CAPS: 1.0000 | ORAL_CAPSULE | Freq: Every day | ORAL | Status: DC

## 2015-04-22 MED ORDER — ACETAMINOPHEN 650 MG RE SUPP: 650.0000 mg | Freq: Four times a day (QID) | RECTAL | Status: DC | PRN

## 2015-04-22 NOTE — ED Provider Notes (Signed)
[facility] Emergency Department Provider Note     Time seen: ----------------------------------------- 5:00 PM on 04/22/2015 -----------------------------------------    I have reviewed the triage vital signs and the nursing notes.   HISTORY  Chief Complaint Animal Bite    HPI Anna Mcmillan is a 70 y.o. female who presents to the ER for evaluation concerning Bite to her right index finger yesterday. Patient has had swelling and redness to the finger and right hand that has progressively gotten worse. Pain around the proximal interphalangeal joint is 8 out of 10. Movement makes it much worse. She has very little mobility of the right index finger. She denies fevers chills or other complaints.   Past Medical History  Diagnosis Date  . Cancer ([place]) 2010    left breast   . Hypertension 1984  . Arthritis   . Thyroid disease   . Pulmonary fibrosis ([place]) 2012  . Graves disease   . Graves disease 1996    Patient Active Problem List   Diagnosis Date Noted  . Head revolving around 11/22/2014  . Infection of urinary tract 11/22/2014  . Temporary cerebral vascular dysfunction 11/22/2014  . Drug intolerance 11/22/2014  . Body tinea 11/22/2014  . Symptomatic states associated with artificial menopause 11/22/2014  . 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 Fatigue 11/22/2014  . OP (osteoporosis) 11/22/2014  . Arthritis of hand, degenerative 11/22/2014  . Cramp in muscle 11/22/2014  . Amnesia 11/22/2014  . Enlarged lymph nodes 11/22/2014  . Lump in neck 11/22/2014  . Lipoma of lower extremity 11/22/2014  . BP (high blood pressure) 11/22/2014  . Hypercholesteremia 11/22/2014  . H/O renal calculi 11/22/2014  . [place]ow disease 11/22/2014  . Essential (primary) hypertension 11/22/2014  . History of chicken pox 11/22/2014  . Breast CA ([place]) 11/22/2014  . Atrophic vaginitis 11/22/2014  . Absolute anemia 11/22/2014  . Encounter for screening  colonoscopy 09/19/2012  . Cancer ([place])   . Breast mass in female 06/20/2012    Past Surgical History  Procedure Laterality Date  . Abdominal hysterectomy  2001  . Colonoscopy  2002  . Breast surgery Left 2002    lumpectomy,   . Breast biopsy Left 2002  . Cosmetic surgery Left     implant 1977    Allergies Statins and Sulfa antibiotics  Social History Social History  Substance Use Topics  . Smoking status: Never Smoker   . Smokeless tobacco: Never Used  . Alcohol Use: Yes     Comment: occassional    Review of Systems Constitutional: Negative for fever. Musculoskeletal: Positive for right index finger pain Skin: Positive for right finger and right hand erythema Neurological: Negative for headaches, focal weakness or numbness.  10-point ROS otherwise negative.  ____________________________________________   PHYSICAL EXAM:  VITAL SIGNS: ED Triage Vitals  Enc Vitals Group     BP 04/22/15 1625 196/90 mmHg     Pulse Rate 04/22/15 1625 95     Resp 04/22/15 1625 18     Temp 04/22/15 1625 98.8 F (37.1 C)     Temp Source 04/22/15 1625 Oral     SpO2 04/22/15 1625 99 %     Weight 04/22/15 1625 133 lb (60.328 kg)     Height 04/22/15 1625 5\' 4"  (1.626 m)     Head Cir --      Peak Flow --      Pain Score 04/22/15 1625 8     Pain Loc --      Pain  Edu? --      Excl. in [place]? --     Constitutional: Alert and oriented. Well appearing and in no distress. Eyes: Conjunctivae are normal. PERRL. Normal extraocular movements. Cardiovascular: Normal rate, regular rhythm. Normal and symmetric distal pulses are present in all extremities. No murmurs, rubs, or gallops. Respiratory: Normal respiratory effort without tachypnea nor retractions. Breath sounds are clear and equal bilaterally. No wheezes/rales/rhonchi. Musculoskeletal: Markedly erythema with fusiform swelling of the right index finger from the metacarpophalangeal joint to the fingertip. There is pain with passive range of  motion of the finger, very limited range of motion of the finger in general. Erythema extends on the palmar aspect of the hand to the metacarpals. Neurologic:  Normal speech and language. No gross focal neurologic deficits are appreciated. Speech is normal. No gait instability. Skin:  Skin is hot to touch in the right index finger, fusiform swelling with erythema that extends to the palmar and plantar aspect of the distal hand. Psychiatric: Mood and affect are normal. Speech and behavior are normal. Patient exhibits appropriate insight and judgment. ____________________________________________  ED COURSE:  Pertinent labs & imaging results that were available during my care of the patient were reviewed by me and considered in my medical decision making (see chart for details). Patient clinically with tenosynovitis that require surgical consultation. I have prescribed IV vancomycin and Zosyn. Dr. Rudene Christians has been consulted for evaluation. ____________________________________________    LABS (pertinent positives/negatives)  Labs Reviewed  CBC WITH DIFFERENTIAL/PLATELET  COMPREHENSIVE METABOLIC PANEL    RADIOLOGY Images were viewed by me  Right index finger x-rays reveal edema but no osseous abnormality  ____________________________________________  FINAL ASSESSMENT AND PLAN  Infectious tenosynovitis  Plan: Patient with labs and imaging as dictated above. Orthopedic surgery has been consult at 4 evaluation. She has received IV antibiotics as dictated above.   Earleen Newport, MD   Earleen Newport, MD 04/22/15 [phone]

## 2015-04-22 NOTE — Progress Notes (Signed)
PHARMACIST - PHYSICIAN ORDER COMMUNICATION  CONCERNING: P&T Medication Policy on Herbal Medications  DESCRIPTION:  This patient's order for:  Krill oil  has been noted.  This product(s) is classified as an "herbal" or natural product. Due to a lack of definitive safety studies or FDA approval, nonstandard manufacturing practices, plus the potential risk of unknown drug-drug interactions while on inpatient medications, the Pharmacy and Therapeutics Committee does not permit the use of "herbal" or natural products of this type within [place].   ACTION TAKEN: The pharmacy department is unable to verify this order at this time and your patient has been informed of this safety policy. Please reevaluate patient's clinical condition at discharge and address if the herbal or natural product(s) should be resumed at that time.   

## 2015-04-22 NOTE — H&P (Signed)
[place] at [place] NAME: Anna Mcmillan    MR#:  [id number]  DATE OF BIRTH:  [birth year]  DATE OF ADMISSION:  04/22/2015  PRIMARY CARE PHYSICIAN: Wilhemena Durie, MD   REQUESTING/REFERRING PHYSICIAN: Dr. Lenise Arena  CHIEF COMPLAINT:   Chief Complaint  Patient presents with  . Animal Bite    HISTORY OF PRESENT ILLNESS:  Anna Mcmillan  is a 70 y.o. female presents after a cat bit her yesterday. Yesterday she heard a noise out on her front porch she opened the door she saw the neighbor's dog headquartered the neighbor's cat. She grabbed a grate Between the cat and dog to separate them. The cat was scared and bit her. The wound bled yesterday but looked okay. This morning when she woke up it was very swollen and red. She went to urgent care and sent her to the ER. She's having 8 out of 10 pain in her right first index finger. She is able to bend at the MP joint, limited movement out of the PIP joint and DIP joint. Hospitalist services were contacted for cat bite cellulitis. The patient thinks she had a tetanus shot within the last 2-3 years by Dr. [name]. Since the cat was provoked by the patient putting a grate in front of the cat, I do not think the patient needs tetanus prophylaxis.  PAST MEDICAL HISTORY:   Past Medical History  Diagnosis Date  . Cancer ([place]) 2010    left breast   . Hypertension 1984  . Arthritis   . Thyroid disease   . Pulmonary fibrosis ([place]) 2012  . Graves disease   . Graves disease 1996    PAST SURGICAL HISTORY:   Past Surgical History  Procedure Laterality Date  . Abdominal hysterectomy  2001  . Colonoscopy  2002  . Breast surgery Left 2002    lumpectomy,   . Breast biopsy Left 2002  . Cosmetic surgery Left     implant 1977    SOCIAL HISTORY:   Social History  Substance Use Topics  . Smoking status: Never Smoker   . Smokeless tobacco: Never Used  . Alcohol Use: Yes     Comment:  occassional    FAMILY HISTORY:   Family History  Problem Relation Age of Onset  . Kidney disease Mother     Kidney failure/ received a kidney transplant when she was 83    DRUG ALLERGIES:   Allergies  Allergen Reactions  . Statins   . Sulfa Antibiotics     REVIEW OF SYSTEMS:  CONSTITUTIONAL: No fever, fatigue or weakness. Shivering today EYES: No blurred or double vision.  EARS, NOSE, AND THROAT: No tinnitus or ear pain. No sore throat RESPIRATORY: No cough, shortness of breath, wheezing or hemoptysis.  CARDIOVASCULAR: No chest pain, orthopnea, edema.  GASTROINTESTINAL: No nausea, vomiting, diarrhea or abdominal pain. No blood in bowel movements GENITOURINARY: No dysuria, hematuria.  ENDOCRINE: No polyuria, nocturia, history of thyroid disease HEMATOLOGY: No anemia, easy bruising or bleeding SKIN: No rash or lesion. MUSCULOSKELETAL: Positive for joint pain. Has some arthritis.   NEUROLOGIC: No tingling, numbness, weakness.  PSYCHIATRY: No anxiety or depression.   MEDICATIONS AT HOME:   Prior to Admission medications   Medication Sig Start Date End Date Taking? Authorizing Provider  Calcium-Vitamin D-Vitamin K (CALCIUM + D + K PO) Take 1 tablet by mouth daily.    Historical Provider, MD  Cholecalciferol (VITAMIN D) 2000 UNITS CAPS Take by  mouth.    Historical Provider, MD  Coenzyme Q10 (CO Q10) 200 MG CAPS Take by mouth. 02/04/11   Historical Provider, MD  Javier Docker Oil 1000 MG CAPS Take 1 capsule by mouth daily.    Historical Provider, MD  metoprolol succinate (TOPROL-XL) 25 MG 24 hr tablet 1 (ONE) TABLET ER 24HR, ORAL, ONCE DAILY 02/28/15   Jerrol Banana., MD  Multiple Vitamins-Minerals (MULTIVITAMIN WITH MINERALS) tablet Take 1 tablet by mouth daily.    Historical Provider, MD      VITAL SIGNS:  Blood pressure 196/90, pulse 95, temperature 98.8 F (37.1 C), temperature source Oral, resp. rate 18, height 5\' 4"  (1.626 m), weight 60.328 kg (133 lb), SpO2 99  %.  PHYSICAL EXAMINATION:  GENERAL:  70 y.o.-year-old patient lying in the bed with no acute distress.  EYES: Pupils equal, round, reactive to light and accommodation. No scleral icterus. Extraocular muscles intact.  HEENT: Head atraumatic, normocephalic. Oropharynx and nasopharynx clear.  NECK:  Supple, no jugular venous distention. No thyroid enlargement, no tenderness.  LUNGS: Normal breath sounds bilaterally, no wheezing, rales,rhonchi or crepitation. No use of accessory muscles of respiration.  CARDIOVASCULAR: S1, S2 normal. No murmurs, rubs, or gallops.  ABDOMEN: Soft, nontender, nondistended. Bowel sounds present. No organomegaly or mass.  EXTREMITIES: No pedal edema, cyanosis, or clubbing. Right first finger swollen and limited movement in the DIP and PIP joints. Patient able to bend the MP joint better. No lymphadenopathy right axilla. NEUROLOGIC: Cranial nerves II through XII are intact. Muscle strength 5/5 in all extremities. Sensation intact. Gait not checked.  PSYCHIATRIC: The patient is alert and oriented x 3.  SKIN: Erythema entire right first finger into the hand.  LABORATORY PANEL:   CBC  Recent Labs Lab 04/22/15 1718  WBC 10.7  HGB 15.1  HCT 43.2  PLT 217   ------------------------------------------------------------------------------------------------------------------  Chemistries   Recent Labs Lab 04/22/15 1718  NA 139  K 3.5  CL 105  CO2 22  GLUCOSE 107*  BUN 9  CREATININE 0.58  CALCIUM 9.3  AST 28  ALT 22  ALKPHOS 71  BILITOT 0.8   ------------------------------------------------------------------------------------------------------------------    RADIOLOGY:  Dg Finger Index Right  04/22/2015  CLINICAL DATA:  Recent CAT bite to second digit with pain and swelling EXAM: RIGHT INDEX FINGER 2+V COMPARISON:  None. FINDINGS: Diffuse soft tissue swelling is noted. Degenerative changes in the interphalangeal joints are seen. No radiopaque foreign  body is noted. No fracture is noted. IMPRESSION: Soft tissue swelling without acute bony abnormality. Electronically Signed   By: Inez Catalina M.D.   On: 04/22/2015 17:49    EKG:   Ordered by me  IMPRESSION AND PLAN:   1. Cat bite cellulitis with increased swelling of the first finger. Appreciate orthopedic consultation. Change antibiotics to Unasyn which covers Pasteurella. Patient had antibiotics already in the ER. 2. Accelerated hypertension give her Toprol. 3. History of breast cancer status post treatment with chemotherapy and radiation and surgery 4. Osteopenia and osteoporosis- patient on calcium and vitamin D  All the records are reviewed and case discussed with ED provider. Management plans discussed with the patient, family and they are in agreement.  CODE STATUS: Full code  TOTAL TIME TAKING CARE OF THIS PATIENT: 50 minutes.    Loletha Grayer M.D on 04/22/2015 at 6:47 PM  Between 7am to 6pm - Pager - [phone]  After 6pm call admission pager [facility]  Office  [phone]  CC: Primary care physician; Delfino Lovett  Cranford Mon, MD

## 2015-04-22 NOTE — ED Notes (Signed)
Pt has a cat bite to right index finger.  Pt has swelling and redness to finger and right hand.

## 2015-04-22 NOTE — Consult Note (Signed)
Patient is 70 year old with a Bite injury last evening. She had markedly increased swelling and erythema today despite local wound care at home that was appropriate. She has extensive swelling of the finger but can be passively flexed and extended without severe pain. The erythema extends to approximately 2 stem is proximal to the MCP joint. Puncture wounds are noted patellar and palmar surface of the index finger.\  Recommendation is for IV antibiotics and if it does not resolve she may need I&D for flexor tendon sheath infection but at present it does not appear to involve the flexor tendon sheath. The finger has been marked to show the level of erythema

## 2015-04-22 NOTE — ED Notes (Signed)
[place] department contacted regarding animal bite at this time.

## 2015-04-23 LAB — BASIC METABOLIC PANEL
Anion gap: 8 (ref 5–15)
BUN: 11 mg/dL (ref 6–20)
CALCIUM: 8.8 mg/dL — AB (ref 8.9–10.3)
CO2: 26 mmol/L (ref 22–32)
CREATININE: 0.66 mg/dL (ref 0.44–1.00)
Chloride: 106 mmol/L (ref 101–111)
GFR calc non Af Amer: >60 mL/min (ref >60)
Glucose, Bld: 96 mg/dL (ref 65–99)
Potassium: 3.4 mmol/L — ABNORMAL LOW (ref 3.5–5.1)
SODIUM: 140 mmol/L (ref 135–145)

## 2015-04-23 LAB — CBC
HCT: 38.9 % (ref 35.0–47.0)
Hemoglobin: 13.5 g/dL (ref 12.0–16.0)
MCH: 31 pg (ref 26.0–34.0)
MCHC: 34.7 g/dL (ref 32.0–36.0)
MCV: 89.1 fL (ref 80.0–100.0)
PLATELETS: 190 10*3/uL (ref 150–440)
RBC: 4.37 MIL/uL (ref 3.80–5.20)
RDW: 13.3 % (ref 11.5–14.5)
WBC: 6.8 10*3/uL (ref 3.6–11.0)

## 2015-04-23 LAB — SEDIMENTATION RATE: SED RATE: 34 mm/h — AB (ref 0–30)

## 2015-04-23 MED ORDER — POTASSIUM CHLORIDE CRYS ER 20 MEQ PO TBCR
40.0000 meq | EXTENDED_RELEASE_TABLET | Freq: Once | ORAL | Status: AC
  Administered 2015-04-23: 40 meq via ORAL
  Filled 2015-04-23: qty 2

## 2015-04-23 NOTE — Progress Notes (Signed)
[place] at [place] NAME: Lindley Lanzi    MR#:  [id number]  DATE OF BIRTH:  [birth year]  SUBJECTIVE:  CHIEF COMPLAINT:   Chief Complaint  Patient presents with  . Animal Bite   Swelling right index finger better. Able to bend it now. No discharge. Afebrile REVIEW OF SYSTEMS:    Review of Systems  Constitutional: Negative for fever and chills.  HENT: Negative for sore throat.   Eyes: Negative for blurred vision, double vision and pain.  Respiratory: Negative for cough, hemoptysis, shortness of breath and wheezing.   Cardiovascular: Negative for chest pain, palpitations, orthopnea and leg swelling.  Gastrointestinal: Negative for heartburn, nausea, vomiting, abdominal pain, diarrhea and constipation.  Genitourinary: Negative for dysuria and hematuria.  Musculoskeletal: Positive for joint pain. Negative for back pain.  Skin: Negative for rash.  Neurological: Negative for sensory change, speech change, focal weakness and headaches.  Endo/Heme/Allergies: Does not bruise/bleed easily.  Psychiatric/Behavioral: Negative for depression. The patient is not nervous/anxious.     DRUG ALLERGIES:   Allergies  Allergen Reactions  . Sulfa Antibiotics Anaphylaxis and Rash  . Statins Other (See Comments)    Severe abdominal pain.    VITALS:  Blood pressure 141/66, pulse 80, temperature 97.8 F (36.6 C), temperature source Oral, resp. rate 16, height 5\' 4"  (1.626 m), weight 60.328 kg (133 lb), SpO2 98 %.  PHYSICAL EXAMINATION:   Physical Exam  GENERAL:  70 y.o.-year-old patient lying in the bed with no acute distress.  EYES:No scleral icterus. Extraocular muscles intact.  HEENT: Head atraumatic, normocephalic. Oropharynx and nasopharynx clear.  NECK:  Supple, no jugular venous distention. No thyroid enlargement, no tenderness.  LUNGS: Normal breath sounds bilaterally, no wheezing, rales, rhonchi. No use of accessory muscles of  respiration.  CARDIOVASCULAR: S1, S2 normal. No murmurs, rubs, or gallops.  EXTREMITIES: Right index finger swollen. Bite wound. No discharge PSYCHIATRIC: The patient is alert and oriented x 3.   LABORATORY PANEL:   CBC  Recent Labs Lab 04/23/15 0329  WBC 6.8  HGB 13.5  HCT 38.9  PLT 190   ------------------------------------------------------------------------------------------------------------------ Chemistries   Recent Labs Lab 04/22/15 1718 04/23/15 0329  NA 139 140  K 3.5 3.4*  CL 105 106  CO2 22 26  GLUCOSE 107* 96  BUN 9 11  CREATININE 0.58 0.66  CALCIUM 9.3 8.8*  AST 28  --   ALT 22  --   ALKPHOS 71  --   BILITOT 0.8  --    ------------------------------------------------------------------------------------------------------------------  Cardiac Enzymes No results for input(s): TROPONINI in the last 168 hours. ------------------------------------------------------------------------------------------------------------------  RADIOLOGY:  Dg Finger Index Right  04/22/2015  CLINICAL DATA:  Recent CAT bite to second digit with pain and swelling EXAM: RIGHT INDEX FINGER 2+V COMPARISON:  None. FINDINGS: Diffuse soft tissue swelling is noted. Degenerative changes in the interphalangeal joints are seen. No radiopaque foreign body is noted. No fracture is noted. IMPRESSION: Soft tissue swelling without acute bony abnormality. Electronically Signed   By: Inez Catalina M.D.   On: 04/22/2015 17:49     ASSESSMENT AND PLAN:   1. Cat bite cellulitis with increased swelling of the right index finger - improving On IV unasyn Will need to continue IV abx one more day due to significant swelling. Likely d/c in AM on PO augmentin if improving.  2. HTN On Toporol  * DVT prophylaxis Patient up and ambulating  All the records are reviewed and case discussed with Care  Management/Social Workerr. Management plans discussed with the patient, family and they are in  agreement.  CODE STATUS: FULL  TOTAL TIME TAKING CARE OF THIS PATIENT: 20 minutes.   POSSIBLE D/C IN 1-2 DAYS, DEPENDING ON CLINICAL CONDITION.  Hillary Bow R M.D on 04/23/2015 at 11:10 AM  Between 7am to 6pm - Pager - [phone]  After 6pm go to [email or web address] - password [email or web address] [facility]  Office  [phone]  CC: Primary care physician; Wilhemena Durie, MD  Note: This dictation was prepared with Dragon dictation along with smaller phrase technology. Any transcriptional errors that result from this process are unintentional.

## 2015-04-23 NOTE — Progress Notes (Signed)
Patient has significant improvement since last p.m. with diminished erythema and slightly decreased swelling. She is also able to move the fingers slightly better.  There is not appear to be flexor tendon sheath infection at present. My recommendation be for another day of IV antibiotics whether that can be done: Not certain

## 2015-04-24 ENCOUNTER — Telehealth: Payer: Self-pay | Admitting: Family Medicine

## 2015-04-24 MED ORDER — AMOXICILLIN-POT CLAVULANATE 875-125 MG PO TABS: 1.0000 | ORAL_TABLET | Freq: Two times a day (BID) | ORAL | Status: DC

## 2015-04-24 NOTE — Telephone Encounter (Signed)
Please call today or tomorrow for hospital follow-up call

## 2015-04-24 NOTE — Telephone Encounter (Signed)
Pt is being discharged from [facility] today for Tenosynovitis  DC 04/24/2015.  I have schedule a hospital follow up appointment/MW

## 2015-04-24 NOTE — Discharge Instructions (Signed)

## 2015-04-24 NOTE — Care Management Important Message (Signed)
Important Message  Patient Details  Name: Anna Mcmillan MRN: [id number] Date of Birth: 16-Sep-1945   Medicare Important Message Given:  Yes    Juliann Pulse A Rodrickus Min 04/24/2015, 10:37 AM

## 2015-04-24 NOTE — Telephone Encounter (Signed)
fiy

## 2015-04-25 NOTE — Telephone Encounter (Signed)
Made pt aware of he appt on the 7th.

## 2015-04-26 NOTE — Discharge Summary (Signed)
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 Cancer ([place]) 2010    left breast   . Hypertension 1984  . Arthritis   . Thyroid disease   . Pulmonary fibrosis ([place]) 2012  . Graves disease   . Graves disease 1996     ADMITTING HISTORY  Anna Mcmillan is a 70 y.o. female presents after a cat bit her yesterday. Yesterday she heard a noise out on her front porch she opened the door she saw the neighbor's dog headquartered the neighbor's cat. She grabbed a grate Between the cat and dog to separate them. The cat was scared and bit her. The wound bled yesterday but looked okay. This morning when she woke up it was very swollen and red. She went to urgent care and sent her to the ER. She's having 8 out of 10 pain in her right first index finger. She is able to bend at the MP joint, limited movement out of the PIP joint and DIP joint. Hospitalist services were contacted for cat bite cellulitis. The patient thinks she had a tetanus shot within the last 2-3 years by Dr. [name]. Since the cat was provoked by the patient putting a grate in front of the cat, I do not think the patient needs tetanus prophylaxis.  HOSPITAL COURSE:   1. Cat bite cellulitis with increased swelling of the right index finger - improving On IV unasyn Change to Augmentin at discharge. Followed by orthopedics during hospital stay. Afebrile.  2. HTN On Toporol  Stable for discharge home to follow-up with her primary care physician.  CONSULTS OBTAINED:      DRUG ALLERGIES:   Allergies  Allergen Reactions  . Sulfa Antibiotics Anaphylaxis and Rash  . Statins Other (See Comments)    Severe abdominal pain.    DISCHARGE MEDICATIONS:   Discharge Medication List as of 04/24/2015 10:49 AM    START taking these medications   Details  amoxicillin-clavulanate (AUGMENTIN) 875-125 MG tablet Take 1 tablet by mouth 2 (two) times daily., Starting 04/24/2015, Until Discontinued, Normal      CONTINUE these medications which have NOT CHANGED   Details  Cholecalciferol (VITAMIN D) 2000 UNITS CAPS Take 2,000 Units by mouth daily. , Until Discontinued, Historical Med    Coenzyme Q10 (CO Q10) 200 MG CAPS Take 200 mg by mouth daily. , Starting 02/04/2011, Until Discontinued, Historical Med    metoprolol tartrate (LOPRESSOR) 12.5 mg TABS tablet Take 6.25 mg by mouth 2 (two) times daily., Until Discontinued, Historical Med    Multiple Vitamins-Minerals (MULTIVITAMIN WITH MINERALS) tablet Take 1 tablet by mouth daily., Until Discontinued, Historical Med    Omega 3 1000 MG CAPS Take 1,000 mg by mouth daily., Until Discontinued, Historical Med      STOP taking these medications     Calcium-Vitamin D-Vitamin K (CALCIUM + D + K PO)      metoprolol succinate (TOPROL-XL) 25 MG 24 hr tablet         Today   VITAL SIGNS:  Blood pressure  155/80, pulse 88, temperature 98.2 F (36.8 C), temperature source Oral, resp. rate 18, height 5\' 4"  (1.626 m), weight 60.328 kg (133 lb), SpO2 99 %.  I/O:  No intake or output data in the 24 hours ending 04/26/15 1557  PHYSICAL EXAMINATION:  Physical Exam  GENERAL:  70 y.o.-year-old patient lying in the bed with no acute distress.  LUNGS: Normal breath sounds bilaterally, no wheezing, rales,rhonchi or crepitation. No use of accessory muscles of respiration.  CARDIOVASCULAR: S1, S2 normal. No murmurs, rubs, or gallops.  ABDOMEN: Soft, non-tender, non-distended. Bowel sounds present. No organomegaly or mass.   NEUROLOGIC: Moves all 4 extremities. PSYCHIATRIC: The patient is alert and oriented x 3.   Mild swelling of the index finger. Much improved. No discharge.  DATA REVIEW:   CBC  Recent Labs Lab 04/23/15 0329  WBC 6.8  HGB 13.5  HCT 38.9  PLT 190    Chemistries   Recent Labs Lab 04/22/15 1718 04/23/15 0329  NA 139 140  K 3.5 3.4*  CL 105 106  CO2 22 26  GLUCOSE 107* 96  BUN 9 11  CREATININE 0.58 0.66  CALCIUM 9.3 8.8*  AST 28  --   ALT 22  --   ALKPHOS 71  --   BILITOT 0.8  --     Cardiac Enzymes No results for input(s): TROPONINI in the last 168 hours.  Microbiology Results  No results found for this or any previous visit.  RADIOLOGY:  No results found.  Follow up with PCP in 1 week.  Management plans discussed with the patient, family and they are in agreement.  CODE STATUS:  Code Status History    Date Active Date Inactive Code Status Order ID Comments User Context   04/22/2015  6:40 PM 04/24/2015  2:08 PM Full Code [id number]  Loletha Grayer, MD ED    Advance Directive Documentation        Most Recent Value   Type of Advance Directive  Living will   Pre-existing out of facility DNR order (yellow form or pink MOST form)     "MOST" Form in Place?        TOTAL TIME TAKING CARE OF THIS PATIENT ON DAY OF DISCHARGE: more than 30 minutes.   Hillary Bow R M.D on 04/26/2015 at 3:57 PM  Between 7am to 6pm - Pager - [phone]  After 6pm go to [email or web address] - password [email or web address]  Office  [phone]  CC: Primary care physician; Wilhemena Durie, MD  Note: This dictation was prepared with Dragon dictation along with smaller phrase technology. Any transcriptional errors that result from this process are unintentional.

## 2015-05-01 ENCOUNTER — Ambulatory Visit (INDEPENDENT_AMBULATORY_CARE_PROVIDER_SITE_OTHER): Payer: Medicare Other | Admitting: Family Medicine

## 2015-05-01 ENCOUNTER — Encounter: Payer: Self-pay | Admitting: Family Medicine

## 2015-05-01 VITALS — BP 138/78 | HR 78 | Temp 98.4°F | Resp 18 | Wt 139.0 lb

## 2015-05-01 DIAGNOSIS — M6588 Other synovitis and tenosynovitis, other site: Secondary | ICD-10-CM

## 2015-05-01 DIAGNOSIS — E7800 Pure hypercholesterolemia, unspecified: Secondary | ICD-10-CM

## 2015-05-01 DIAGNOSIS — E876 Hypokalemia: Secondary | ICD-10-CM

## 2015-05-01 DIAGNOSIS — I10 Essential (primary) hypertension: Secondary | ICD-10-CM | POA: Diagnosis not present

## 2015-05-01 DIAGNOSIS — W5501XA Bitten by cat, initial encounter: Secondary | ICD-10-CM | POA: Diagnosis not present

## 2015-05-01 DIAGNOSIS — M659 Synovitis and tenosynovitis, unspecified: Secondary | ICD-10-CM

## 2015-05-01 NOTE — Progress Notes (Signed)
Patient ID: Anna Mcmillan, female   DOB: 1945-04-03, 70 y.o.   MRN: [id number]    Subjective:  HPI Pt is here for a follow up from the hospital. She was admitted 04/22/15 and d/c'd on 04/24/15.   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   She was given Anasyn, labs drawn, xray  Labs showed low calcium and low potassium.    She was sent home on Augmentin that she just finished yesterday.   Pt is feeling much better. Her hand is almost completely well and swelling is now mild.   Prior to Admission medications   Medication Sig Start Date End Date Taking? Authorizing Provider  Cholecalciferol (VITAMIN D) 2000 UNITS CAPS Take 2,000 Units by mouth daily.    Yes Historical Provider, MD  Coenzyme Q10 (CO Q10) 200 MG CAPS Take 200 mg by mouth daily.  02/04/11  Yes Historical Provider, MD  metoprolol succinate (TOPROL-XL) 25 MG 24 hr tablet 12.5 mg 2 (two) times daily.  04/27/15  Yes Historical Provider, MD  Multiple Vitamins-Minerals (MULTIVITAMIN WITH MINERALS) tablet Take 1 tablet by mouth daily.   Yes Historical Provider, MD  Omega 3 1000 MG CAPS Take 1,000 mg by mouth daily.   Yes Historical Provider, MD  amoxicillin-clavulanate (AUGMENTIN) 875-125 MG tablet Take 1 tablet by mouth 2 (two) times daily. Patient not taking: Reported on 05/01/2015 04/24/15   Hillary Bow, MD    Patient Active Problem List   Diagnosis Date Noted  . 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 Head revolving around 11/22/2014  . Infection of urinary tract 11/22/2014  . Temporary cerebral vascular dysfunction 11/22/2014  . Drug intolerance 11/22/2014  . Body tinea 11/22/2014  . Symptomatic states associated with artificial menopause 11/22/2014  . 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 Fatigue 11/22/2014  . OP (osteoporosis) 11/22/2014  . Arthritis of hand, degenerative 11/22/2014  . Cramp in muscle 11/22/2014  . Amnesia 11/22/2014  . Enlarged lymph nodes 11/22/2014  .  Lump in neck 11/22/2014  . Lipoma of lower extremity 11/22/2014  . BP (high blood pressure) 11/22/2014  . Hypercholesteremia 11/22/2014  . H/O renal calculi 11/22/2014  . [place]ow disease 11/22/2014  . Essential (primary) hypertension 11/22/2014  . History of chicken pox 11/22/2014  . Breast CA ([place]) 11/22/2014  . Atrophic vaginitis 11/22/2014  . Absolute anemia 11/22/2014  . Encounter for screening colonoscopy 09/19/2012  . Cancer ([place])   . Breast mass in female 06/20/2012    Past Medical History  Diagnosis Date  . Cancer ([place]) 2010    left breast   . Hypertension 1984  . Arthritis   . Thyroid disease   . Pulmonary fibrosis ([place]) 2012  . Graves disease   . Graves disease 1996    Social History   Social History  . Marital Status: Married    Spouse Name: N/A  . Number of Children: N/A  . Years of Education: N/A   Occupational History  . Not on file.   Social History Main Topics  . Smoking status: Never Smoker   . Smokeless tobacco: Never Used  . Alcohol Use: Yes     Comment: occassional  . Drug Use: No  . Sexual Activity: Not on file   Other Topics Concern  . Not on file   Social History Narrative    Allergies  Allergen Reactions  . Sulfa Antibiotics Anaphylaxis and Rash  . Statins Other (See Comments)    Severe abdominal  pain.    Review of Systems  Constitutional: Negative.   HENT: Negative.   Eyes: Negative.   Respiratory: Negative.   Cardiovascular: Negative.   Gastrointestinal: Negative.   Genitourinary: Negative.   Musculoskeletal: Negative.   Skin: Negative.   Neurological: Negative.   Endo/Heme/Allergies: Negative.   Psychiatric/Behavioral: Negative.     Immunization History  Administered Date(s) Administered  . Influenza, High Dose Seasonal PF 11/22/2014  . Influenza-Unspecified 11/24/2013   Objective:  BP 138/78 mmHg  Pulse 78  Temp(Src) 98.4 F (36.9 C) (Oral)  Resp 18  Wt 139 lb (63.05 kg)  Physical Exam    Constitutional: She is oriented to person, place, and time and well-developed, well-nourished, and in no distress.  Eyes: Conjunctivae are normal. Pupils are equal, round, and reactive to light.  Neck: Normal range of motion. Neck supple.  Cardiovascular: Normal rate, regular rhythm, normal heart sounds and intact distal pulses.   Pulmonary/Chest: Effort normal and breath sounds normal.  Musculoskeletal: Normal range of motion.  Neurological: She is alert and oriented to person, place, and time. She has normal reflexes. Gait normal. GCS score is 15.  Skin: Skin is warm and dry.  Psychiatric: Mood, memory, affect and judgment normal.    Lab Results  Component Value Date   WBC 6.8 04/23/2015   HGB 13.5 04/23/2015   HCT 38.9 04/23/2015   PLT 190 04/23/2015   GLUCOSE 96 04/23/2015   TSH 0.774 12/09/2011    CMP     Component Value Date/Time   NA 140 04/23/2015 0329   NA 140 01/26/2013 1105   K 3.4* 04/23/2015 0329   K 3.7 01/26/2013 1105   CL 106 04/23/2015 0329   CL 103 01/26/2013 1105   CO2 26 04/23/2015 0329   CO2 32 01/26/2013 1105   GLUCOSE 96 04/23/2015 0329   GLUCOSE 96 01/26/2013 1105   BUN 11 04/23/2015 0329   BUN 14 01/26/2013 1105   CREATININE 0.66 04/23/2015 0329   CREATININE 0.78 01/26/2013 1105   CALCIUM 8.8* 04/23/2015 0329   CALCIUM 9.6 01/26/2013 1105   PROT 7.9 04/22/2015 1718   PROT 7.4 01/26/2013 1105   ALBUMIN 4.5 04/22/2015 1718   ALBUMIN 3.8 01/26/2013 1105   AST 28 04/22/2015 1718   AST 28 01/26/2013 1105   ALT 22 04/22/2015 1718   ALT 31 01/26/2013 1105   ALKPHOS 71 04/22/2015 1718   ALKPHOS 84 01/26/2013 1105   BILITOT 0.8 04/22/2015 1718   BILITOT 0.4 01/26/2013 1105   GFRNONAA >60 04/23/2015 0329   GFRNONAA >60 01/26/2013 1105   GFRAA >60 04/23/2015 0329   GFRAA >60 01/26/2013 1105    Assessment and Plan :  1. Tenosynovitis of finger Much improved. Follow up in 2 weeks to make sure she is continuing to heal. Will call to be seen  sooner if she starts to get worse again.   2. Cat bite, initial encounter This Was an attacked by a dog so it is no concern that the cat ended up biting the patient as this would  be a normal reaction.there is some concern about the Possibly having rabies but this was not the action of the rabid  Animal.This  was discussed at length with the patient and her husband.  3. Hypokalemia Per labs in hospital.  - Comprehensive metabolic panel  4. Essential (primary) hypertension  - CBC with Differential/Platelet - TSH  5. Hypercholesteremia  - Lipid Panel With LDL/HDL Ratio 6. Breast cancer Per oncology.  Patient was  seen and examined by Dr. Miguel Aschoff, and noted scribed by Webb Laws, [place] MD [facility] 05/01/2015 3:12 PM

## 2015-05-11 DIAGNOSIS — I10 Essential (primary) hypertension: Secondary | ICD-10-CM | POA: Diagnosis not present

## 2015-05-11 DIAGNOSIS — E876 Hypokalemia: Secondary | ICD-10-CM | POA: Diagnosis not present

## 2015-05-11 DIAGNOSIS — E7800 Pure hypercholesterolemia, unspecified: Secondary | ICD-10-CM | POA: Diagnosis not present

## 2015-05-12 LAB — COMPREHENSIVE METABOLIC PANEL
A/G RATIO: 1.7 (ref 1.2–2.2)
ALK PHOS: 83 IU/L (ref 39–117)
ALT: 18 IU/L (ref 0–32)
AST: 28 IU/L (ref 0–40)
Albumin: 4.6 g/dL (ref 3.5–4.8)
BILIRUBIN TOTAL: 0.5 mg/dL (ref 0.0–1.2)
BUN/Creatinine Ratio: 13 (ref 11–26)
BUN: 10 mg/dL (ref 8–27)
CO2: 26 mmol/L (ref 18–29)
Calcium: 9.8 mg/dL (ref 8.7–10.3)
Chloride: 101 mmol/L (ref 96–106)
Creatinine, Ser: 0.77 mg/dL (ref 0.57–1.00)
GFR calc Af Amer: 90 mL/min/{1.73_m2} (ref >59)
GFR, EST NON AFRICAN AMERICAN: 78 mL/min/{1.73_m2} (ref >59)
GLOBULIN, TOTAL: 2.7 g/dL (ref 1.5–4.5)
Glucose: 105 mg/dL — ABNORMAL HIGH (ref 65–99)
POTASSIUM: 5 mmol/L (ref 3.5–5.2)
SODIUM: 146 mmol/L — AB (ref 134–144)
Total Protein: 7.3 g/dL (ref 6.0–8.5)

## 2015-05-12 LAB — CBC WITH DIFFERENTIAL/PLATELET
BASOS: 0 %
Basophils Absolute: 0 10*3/uL (ref 0.0–0.2)
EOS (ABSOLUTE): 0 10*3/uL (ref 0.0–0.4)
Eos: 1 %
Hematocrit: 43.5 % (ref 34.0–46.6)
Hemoglobin: 14.5 g/dL (ref 11.1–15.9)
IMMATURE GRANULOCYTES: 0 %
Immature Grans (Abs): 0 10*3/uL (ref 0.0–0.1)
Lymphocytes Absolute: 1.6 10*3/uL (ref 0.7–3.1)
Lymphs: 38 %
MCH: 31.1 pg (ref 26.6–33.0)
MCHC: 33.3 g/dL (ref 31.5–35.7)
MCV: 93 fL (ref 79–97)
MONOS ABS: 0.4 10*3/uL (ref 0.1–0.9)
Monocytes: 9 %
NEUTROS PCT: 52 %
Neutrophils Absolute: 2.1 10*3/uL (ref 1.4–7.0)
PLATELETS: 230 10*3/uL (ref 150–379)
RBC: 4.66 x10E6/uL (ref 3.77–5.28)
RDW: 13.1 % (ref 12.3–15.4)
WBC: 4.1 10*3/uL (ref 3.4–10.8)

## 2015-05-12 LAB — LIPID PANEL WITH LDL/HDL RATIO
CHOLESTEROL TOTAL: 320 mg/dL — AB (ref 100–199)
HDL: 81 mg/dL (ref >39)
LDL CALC: 222 mg/dL — AB (ref 0–99)
LDl/HDL Ratio: 2.7 ratio units (ref 0.0–3.2)
TRIGLYCERIDES: 85 mg/dL (ref 0–149)
VLDL CHOLESTEROL CAL: 17 mg/dL (ref 5–40)

## 2015-05-12 LAB — TSH: TSH: 1.15 u[IU]/mL (ref 0.450–4.500)

## 2015-05-30 ENCOUNTER — Encounter: Payer: Self-pay | Admitting: Family Medicine

## 2015-05-30 ENCOUNTER — Ambulatory Visit (INDEPENDENT_AMBULATORY_CARE_PROVIDER_SITE_OTHER): Payer: Medicare Other | Admitting: Family Medicine

## 2015-05-30 ENCOUNTER — Encounter: Payer: Self-pay | Admitting: *Deleted

## 2015-05-30 VITALS — BP 132/78 | Temp 98.4°F | Resp 16 | Wt 137.0 lb

## 2015-05-30 DIAGNOSIS — N644 Mastodynia: Secondary | ICD-10-CM | POA: Diagnosis not present

## 2015-05-30 DIAGNOSIS — E7800 Pure hypercholesterolemia, unspecified: Secondary | ICD-10-CM

## 2015-05-30 MED ORDER — ROSUVASTATIN CALCIUM 10 MG PO TABS: 10.0000 mg | ORAL_TABLET | Freq: Every day | ORAL | Status: DC

## 2015-05-30 NOTE — Progress Notes (Signed)
Patient ID: Anna Mcmillan, female   DOB: 1945-03-10, 70 y.o.   MRN: [id number]       [name]    DOB: [birth year]   70 y.o.   MRN: [id number] Visit Date: 05/30/2015  Today's Provider: Wilhemena Durie, MD   Chief Complaint  Patient presents with  . Hyperlipidemia    here to discuss treatment  . Breast Pain   Subjective:    HPI Patient comes in today to discuss treatment for cholesterol. Patient had labs done on 05/11/2015 and total cholesterol was 320. LDL was 222. Patient reports that she has taken STATINs before in the past ( Rouvastatin and Pravastatin) and experienced abdominal cramping.  Patient does take a daily fish oil supplement.  Patient also mentions that she has experienced pain in her left breast.     Allergies  Allergen Reactions  . Sulfa Antibiotics Anaphylaxis and Rash  . Statins Other (See Comments)    Severe abdominal pain.   Previous Medications   CHOLECALCIFEROL (VITAMIN D) 2000 UNITS CAPS    Take 2,000 Units by mouth daily.    COENZYME Q10 (CO Q10) 200 MG CAPS    Take 200 mg by mouth daily.    METOPROLOL SUCCINATE (TOPROL-XL) 25 MG 24 HR TABLET    12.5 mg 2 (two) times daily.    MULTIPLE VITAMINS-MINERALS (MULTIVITAMIN WITH MINERALS) TABLET    Take 1 tablet by mouth daily.   OMEGA 3 1000 MG CAPS    Take 1,000 mg by mouth daily.    Review of Systems  Constitutional: Negative.   HENT: Negative.   Eyes: Negative.   Respiratory: Negative.   Cardiovascular: Negative.   Gastrointestinal: Negative.   Endocrine: Negative.   Musculoskeletal: Positive for myalgias.  Skin: Negative.   Allergic/Immunologic: Negative.   Hematological: Negative.   Psychiatric/Behavioral: Negative.     Social History  Substance Use Topics  . Smoking status: Never Smoker   . Smokeless tobacco: Never Used  . Alcohol Use: Yes     Comment: occassional   Objective:   BP 132/78 mmHg  Temp(Src) 98.4 F (36.9 C)  Resp 16  Wt 137 lb (62.143  kg)  Physical Exam  Constitutional: She is oriented to person, place, and time. She appears well-developed and well-nourished.  HENT:  Head: Normocephalic and atraumatic.  Right Ear: External ear normal.  Left Ear: External ear normal.  Nose: Nose normal.  Eyes: Conjunctivae are normal.  Neck: Neck supple.  Cardiovascular: Normal rate, regular rhythm and normal heart sounds.   Pulmonary/Chest: Effort normal and breath sounds normal.  Breasts exam is normal today.  Abdominal: Soft. Bowel sounds are normal.  Lymphadenopathy:    She has no cervical adenopathy.  Neurological: She is alert and oriented to person, place, and time.  Skin: Skin is warm and dry.  Psychiatric: She has a normal mood and affect. Her behavior is normal. Judgment and thought content normal.        Assessment & Plan:     1. Hypercholesteremia Family history of any cardiovascular disease. We'll see how patient tolerates low-dose statin. - rosuvastatin (CRESTOR) 10 MG tablet; Take 1 tablet (10 mg total) by mouth daily.  Dispense: 30 tablet; Refill: 12  2. Breast pain History of left breast cancer 2011. - Ambulatory referral to General Surgery      I have done the exam and reviewed the above chart and it is accurate to the best of my knowledge.  Richard Cranford Mon,  MD  [facility]

## 2015-06-07 ENCOUNTER — Ambulatory Visit: Payer: Medicare Other | Admitting: General Surgery

## 2015-06-08 ENCOUNTER — Encounter: Payer: Self-pay | Admitting: Family Medicine

## 2015-06-13 ENCOUNTER — Ambulatory Visit (INDEPENDENT_AMBULATORY_CARE_PROVIDER_SITE_OTHER): Payer: Medicare Other | Admitting: General Surgery

## 2015-06-13 ENCOUNTER — Encounter: Payer: Self-pay | Admitting: General Surgery

## 2015-06-13 ENCOUNTER — Encounter: Payer: Medicare Other | Admitting: Family Medicine

## 2015-06-13 ENCOUNTER — Other Ambulatory Visit: Payer: Self-pay

## 2015-06-13 VITALS — BP 140/88 | HR 78 | Resp 13 | Ht 64.0 in | Wt 134.6 lb

## 2015-06-13 DIAGNOSIS — N644 Mastodynia: Secondary | ICD-10-CM

## 2015-06-13 DIAGNOSIS — Z853 Personal history of malignant neoplasm of breast: Secondary | ICD-10-CM | POA: Diagnosis not present

## 2015-06-13 DIAGNOSIS — Z9882 Breast implant status: Secondary | ICD-10-CM | POA: Diagnosis not present

## 2015-06-13 NOTE — Progress Notes (Signed)
Patient ID: Anna Mcmillan, female   DOB: Oct 14, 1945, 70 y.o.   MRN: [id number]  Chief Complaint  Patient presents with  . Other    left breast pain    HPI Anna Mcmillan is a 70 y.o. female here today for a evaluation of left breast pain. Patient noticed this about 1 month ago. She reports a jabbing sensation in the lateral portion of her left breast in a crescent shape. She states it does wake her up at night but does not last very long. Her most recent mammogram on 12/11/14 was stable.   She has a history of left breast cancer with lumpectomy surgery.  Her daughter lives [place] and has been diagnosed with breast cancer and has had a bilateral mastectomy.  I personally reviewed the patient's history.  HPI  Past Medical History  Diagnosis Date  . Cancer ([place]) 2010    left breast   . Hypertension 1984  . Arthritis   . Pulmonary fibrosis ([place]) 2012  . Thyroid disease   . Graves disease 1996    Past Surgical History  Procedure Laterality Date  . Abdominal hysterectomy  2001  . Colonoscopy  2002  . Breast surgery Left 2002    lumpectomy,   . Breast biopsy Left 2002  . Cosmetic surgery Left     implant 1977    Family History  Problem Relation Age of Onset  . Kidney disease Mother     Kidney failure/ received a kidney transplant when she was 35  . Breast cancer Daughter 27    genetic negative    Social History Social History  Substance Use Topics  . Smoking status: Never Smoker   . Smokeless tobacco: Never Used  . Alcohol Use: Yes     Comment: occassional    Allergies  Allergen Reactions  . Sulfa Antibiotics Anaphylaxis and Rash  . Statins Other (See Comments)    Severe abdominal pain.    Current Outpatient Prescriptions  Medication Sig Dispense Refill  . Cholecalciferol (VITAMIN D) 2000 UNITS CAPS Take 2,000 Units by mouth daily.     . Coenzyme Q10 (CO Q10) 200 MG CAPS Take 200 mg by mouth daily.     . metoprolol succinate (TOPROL-XL) 25 MG 24 hr  tablet 12.5 mg 2 (two) times daily.     . Multiple Vitamins-Minerals (MULTIVITAMIN WITH MINERALS) tablet Take 1 tablet by mouth daily.    . Omega 3 1000 MG CAPS Take 1,000 mg by mouth daily.    . rosuvastatin (CRESTOR) 10 MG tablet Take 1 tablet (10 mg total) by mouth daily. 30 tablet 12   No current facility-administered medications for this visit.    Review of Systems Review of Systems  Constitutional: Negative.   Respiratory: Negative.   Cardiovascular: Negative.     Blood pressure 140/88, pulse 78, resp. rate 13, height '5\' 4"'  (1.626 m), weight 134 lb 9.6 oz (61.054 kg).  Physical Exam Physical Exam  Constitutional: She is oriented to person, place, and time. She appears well-developed and well-nourished.  HENT:  Mouth/Throat: Oropharynx is clear and moist.  Eyes: Conjunctivae are normal. No scleral icterus.  Neck: Neck supple.  Cardiovascular: Normal rate, regular rhythm and normal heart sounds.   Pulmonary/Chest: Effort normal and breath sounds normal. Right breast exhibits no inverted nipple, no mass, no nipple discharge, no skin change and no tenderness. Left breast exhibits no inverted nipple, no mass, no nipple discharge, no skin change and no tenderness.  Left breast well healed incision at 12 o'clock ,mild dimpling at 6 o'clock.  Lymphadenopathy:    She has no cervical adenopathy.  Neurological: She is alert and oriented to person, place, and time.  Skin: Skin is warm and dry.  Psychiatric: Her behavior is normal.    Data Reviewed 12/11/2014 mammograms completed at [facility] in [place] reported as normal. Images were not available for review.  Assessment    Unremarkable breast exam.    Plan    Considering the patient's previous breast augmentation and treatment for breast cancer, with her report of a change general clinical exam, will arrange for a breast MRI. She is aware she may need to have repeat diagnostic mammograms before the procedure as its been 6  months since her last imaging study.  Of note, her daughter was BRCA negative.    Patient to has a MRI.  PCP:  Anna Mcmillan, Anna Mcmillan This information has been scribed by Anna Fetch RN, BSN,BC.    Anna Mcmillan 06/14/2015, 4:57 PM

## 2015-06-13 NOTE — Patient Instructions (Addendum)
The patient is aware to call back for any questions or concerns. Patient to has a MRI.

## 2015-06-14 DIAGNOSIS — N644 Mastodynia: Secondary | ICD-10-CM | POA: Insufficient documentation

## 2015-06-20 ENCOUNTER — Ambulatory Visit: Payer: Medicare Other | Admitting: Family Medicine

## 2015-06-21 ENCOUNTER — Ambulatory Visit
Admission: RE | Admit: 2015-06-21 | Discharge: 2015-06-21 | Disposition: A | Discharge status: Home / Self Care | Payer: Medicare Other | Source: Ambulatory Visit | Attending: General Surgery | Admitting: General Surgery

## 2015-06-21 DIAGNOSIS — N644 Mastodynia: Secondary | ICD-10-CM

## 2015-06-21 DIAGNOSIS — Z9882 Breast implant status: Secondary | ICD-10-CM

## 2015-06-21 DIAGNOSIS — Z853 Personal history of malignant neoplasm of breast: Secondary | ICD-10-CM

## 2015-06-21 MED ORDER — GADOBENATE DIMEGLUMINE 529 MG/ML IV SOLN
12.0000 mL | Freq: Once | INTRAVENOUS | Status: AC | PRN
  Administered 2015-06-21: 12 mL via INTRAVENOUS

## 2015-06-25 ENCOUNTER — Telehealth: Payer: Self-pay

## 2015-06-25 NOTE — Telephone Encounter (Signed)
Patient called and wanted her results from her Breast MRI done in [place] last week.

## 2015-06-28 NOTE — Telephone Encounter (Signed)
06-28-15 @ 2:00 I CALLED PT & LET HER KNOW THAT DR BYRNETT WOULD CALL HER THIS EVENING,PER THE CONVERSATION IHAD WITH DR [facility]

## 2015-06-28 NOTE — Telephone Encounter (Signed)
06-28-15 @ 1:41PM PT CALLED WANTING MRI RESULTS DONE 06-21-15 IN [place]. SHE STATES SHE'S CALLED Monday & Tuesday WITH NO RETURN CALL. PLEASE CALL @ [phone]

## 2015-10-31 ENCOUNTER — Ambulatory Visit (INDEPENDENT_AMBULATORY_CARE_PROVIDER_SITE_OTHER): Payer: Medicare Other | Admitting: Family Medicine

## 2015-10-31 ENCOUNTER — Encounter: Payer: Self-pay | Admitting: Family Medicine

## 2015-10-31 VITALS — BP 162/90 | HR 76 | Temp 98.3°F | Resp 12 | Ht 64.25 in | Wt 139.0 lb

## 2015-10-31 DIAGNOSIS — Z23 Encounter for immunization: Secondary | ICD-10-CM | POA: Diagnosis not present

## 2015-10-31 DIAGNOSIS — Z1211 Encounter for screening for malignant neoplasm of colon: Secondary | ICD-10-CM | POA: Diagnosis not present

## 2015-10-31 DIAGNOSIS — Z0000 Encounter for general adult medical examination without abnormal findings: Secondary | ICD-10-CM | POA: Diagnosis not present

## 2015-10-31 NOTE — Progress Notes (Signed)
Patient: Anna Mcmillan, Female    DOB: Jul 01, 1945, 70 y.o.   MRN: [id number] Visit Date: 10/31/2015  Today's Provider: Wilhemena Durie, MD   Chief Complaint  Patient presents with  . Medicare Wellness   Subjective:   Anna Mcmillan is a 70 y.o. female who presents today for her Subsequent Annual Wellness Visit. She feels well. She reports exercising 3 times a week yard work and walking. She reports she is sleeping well.  Immunization History  Administered Date(s) Administered  . Influenza, High Dose Seasonal PF 11/22/2014  . Influenza-Unspecified 11/24/2013  . Pneumococcal Conjugate-13 05/24/2014  . Pneumococcal Polysaccharide-23 02/08/2013  . Tdap 09/24/2005   Last colonoscopy 12/14/12 diverticulosis repeat 10 years         Mammogram 06/21/15  Review of Systems  Constitutional: Negative.   HENT: Positive for ear pain.   Eyes: Negative.   Respiratory: Negative.   Cardiovascular: Negative.   Gastrointestinal: Negative.   Endocrine: Negative.   Genitourinary: Negative.   Musculoskeletal: Positive for arthralgias.  Skin: Negative.   Allergic/Immunologic: Negative.   Neurological: Negative.   Hematological: Negative.   Psychiatric/Behavioral: Negative.     Patient Active Problem List   Diagnosis Date Noted  . Breast pain, left 06/14/2015  . 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 Head revolving around 11/22/2014  . Infection of urinary tract 11/22/2014  . Temporary cerebral vascular dysfunction 11/22/2014  . Drug intolerance 11/22/2014  . Body tinea 11/22/2014  . Symptomatic states associated with artificial menopause 11/22/2014  . 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 Fatigue 11/22/2014  . OP (osteoporosis) 11/22/2014  . Arthritis of hand, degenerative 11/22/2014  . Cramp in muscle 11/22/2014  . Amnesia 11/22/2014  . Enlarged lymph nodes 11/22/2014  . Lump in neck 11/22/2014  . Lipoma of lower extremity 11/22/2014  . BP (high blood pressure)  11/22/2014  . Hypercholesteremia 11/22/2014  . H/O renal calculi 11/22/2014  . [place]ow disease 11/22/2014  . Essential (primary) hypertension 11/22/2014  . History of chicken pox 11/22/2014  . Breast CA ([place]) 11/22/2014  . Atrophic vaginitis 11/22/2014  . Absolute anemia 11/22/2014  . Encounter for screening colonoscopy 09/19/2012  . Cancer ([place])   . Breast mass in female 06/20/2012    Social History   Social History  . Marital status: Married    Spouse name: N/A  . Number of children: N/A  . Years of education: N/A   Occupational History  . Not on file.   Social History Main Topics  . Smoking status: Never Smoker  . Smokeless tobacco: Never Used  . Alcohol use Yes     Comment: 2 times a year maybe  . Drug use: No  . Sexual activity: Not on file   Other Topics Concern  . Not on file   Social History Narrative  . No narrative on file    Past Surgical History:  Procedure Laterality Date  . ABDOMINAL HYSTERECTOMY  2001  . BREAST BIOPSY Left 2002  . BREAST SURGERY Left 2002   lumpectomy,   . COLONOSCOPY  2002  . COSMETIC SURGERY Left    implant 1977    Her family history includes Breast cancer (age of onset: 63) in her daughter; Kidney disease in her mother.    Outpatient Medications Prior to Visit  Medication Sig Dispense Refill  . Cholecalciferol (VITAMIN D) 2000 UNITS CAPS Take 2,000 Units by mouth daily.     . Coenzyme Q10 (CO Q10) 200 MG CAPS Take 200  mg by mouth daily.     . metoprolol succinate (TOPROL-XL) 25 MG 24 hr tablet 12.5 mg 2 (two) times daily.     . Multiple Vitamins-Minerals (MULTIVITAMIN WITH MINERALS) tablet Take 1 tablet by mouth daily.    . Omega 3 1000 MG CAPS Take 1,000 mg by mouth daily.    . rosuvastatin (CRESTOR) 10 MG tablet Take 1 tablet (10 mg total) by mouth daily. 30 tablet 12   No facility-administered medications prior to visit.     Allergies  Allergen Reactions  . Sulfa Antibiotics Anaphylaxis and Rash  . Statins Other  (See Comments)    Severe abdominal pain.    Patient Care Team: Jerrol Banana., MD as PCP - General (Family Medicine) Robert Bellow, MD (General Surgery) Jerrol Banana., MD (Family Medicine)  Objective:   Vitals:  Vitals:   10/31/15 1046  BP: (!) 162/90  Pulse: 76  Resp: 12  Temp: 98.3 F (36.8 C)  Weight: 139 lb (63 kg)  Height: 5' 4.25" (1.632 m)    Physical Exam  Constitutional: She is oriented to person, place, and time. She appears well-developed and well-nourished.  HENT:  Head: Normocephalic and atraumatic.  Right Ear: External ear normal.  Left Ear: External ear normal.  Eyes: Conjunctivae are normal. Pupils are equal, round, and reactive to light.  Neck: Normal range of motion. Neck supple.  Cardiovascular: Normal rate, regular rhythm, normal heart sounds and intact distal pulses.   No murmur heard. Pulmonary/Chest: Effort normal and breath sounds normal. No respiratory distress. She has no wheezes. She exhibits no mass and no tenderness. Right breast exhibits no inverted nipple and no mass. Left breast exhibits no inverted nipple and no mass.  Bilateral breast implants present.  Abdominal: Soft. She exhibits no distension. There is no tenderness. There is no rebound.  Genitourinary:  Genitourinary Comments: Patient had total hysterectomy  Musculoskeletal: Normal range of motion. She exhibits no edema or tenderness.  Neurological: She is alert and oriented to person, place, and time.  Skin: Skin is warm and dry. No rash noted. No erythema.  Psychiatric: She has a normal mood and affect. Her behavior is normal. Judgment and thought content normal.    Activities of Daily Living In your present state of health, do you have any difficulty performing the following activities: 10/31/2015 05/01/2015  Hearing? N N  Vision? N N  Difficulty concentrating or making decisions? N N  Walking or climbing stairs? N N  Dressing or bathing? N N  Doing errands,  shopping? N N  Some recent data might be hidden    Fall Risk Assessment Fall Risk  10/31/2015 05/01/2015 02/16/2015  Falls in the past year? No No Yes  Number falls in past yr: - - (No Data)  Injury with Fall? - - (No Data)     Depression Screen PHQ 2/9 Scores 10/31/2015 05/01/2015  PHQ - 2 Score 0 0    Cognitive Testing - 6-CIT    Year: 0 4 points  Month: 0 3 points  Memorize "Pia Mau, [address] [place]"  Time (within 1 hour:) 0 3 points  Count backwards from 20: 0 2 4 points  Name months of year: 0 2 4 points  Repeat Address: 0 2 4 6 8 10  points   Total Score: 6/28  Interpretation : Normal (0-7) Abnormal (8-28)    Assessment & Plan:     Annual Wellness Visit  Reviewed patient's Family Medical History Reviewed  and updated list of patient's medical providers Assessment of cognitive impairment was done Assessed patient's functional ability Established a written schedule for health screening [place] Completed and Reviewed  1. Medicare annual wellness visit, subsequent  2. Need for influenza vaccination Administered today. Patient is due for Td booster but this will not be covered unless patient gets a cut. Will follow.  3. Colon cancer screening Patient declines DRE exam today, will get this done in 2018.  4. Finger swelling Right middle finger swelling and stiffness. Offered referral to orthopedic when patient desires. OA HPI, Exam and A&P transcribed under the direction and in the presence of Miguel Aschoff, MD.

## 2016-01-22 ENCOUNTER — Other Ambulatory Visit: Payer: Self-pay

## 2016-01-22 MED ORDER — METOPROLOL SUCCINATE ER 25 MG PO TB24: 25.0000 mg | ORAL_TABLET | Freq: Every day | ORAL | 3 refills | Status: DC

## 2016-01-23 ENCOUNTER — Encounter: Payer: Self-pay | Admitting: Oncology

## 2016-01-23 DIAGNOSIS — Z853 Personal history of malignant neoplasm of breast: Secondary | ICD-10-CM | POA: Diagnosis not present

## 2016-01-23 DIAGNOSIS — Z1231 Encounter for screening mammogram for malignant neoplasm of breast: Secondary | ICD-10-CM | POA: Diagnosis not present

## 2016-01-24 DIAGNOSIS — H2513 Age-related nuclear cataract, bilateral: Secondary | ICD-10-CM | POA: Diagnosis not present

## 2016-01-28 ENCOUNTER — Encounter: Payer: Self-pay | Admitting: Family Medicine

## 2016-02-06 DIAGNOSIS — X32XXXA Exposure to sunlight, initial encounter: Secondary | ICD-10-CM | POA: Diagnosis not present

## 2016-02-06 DIAGNOSIS — L820 Inflamed seborrheic keratosis: Secondary | ICD-10-CM | POA: Diagnosis not present

## 2016-02-06 DIAGNOSIS — C44311 Basal cell carcinoma of skin of nose: Secondary | ICD-10-CM | POA: Diagnosis not present

## 2016-02-06 DIAGNOSIS — L570 Actinic keratosis: Secondary | ICD-10-CM | POA: Diagnosis not present

## 2016-02-06 DIAGNOSIS — Z85828 Personal history of other malignant neoplasm of skin: Secondary | ICD-10-CM | POA: Diagnosis not present

## 2016-02-06 DIAGNOSIS — D485 Neoplasm of uncertain behavior of skin: Secondary | ICD-10-CM | POA: Diagnosis not present

## 2016-02-06 DIAGNOSIS — D225 Melanocytic nevi of trunk: Secondary | ICD-10-CM | POA: Diagnosis not present

## 2016-02-06 DIAGNOSIS — Z08 Encounter for follow-up examination after completed treatment for malignant neoplasm: Secondary | ICD-10-CM | POA: Diagnosis not present

## 2016-02-26 ENCOUNTER — Inpatient Hospital Stay: Payer: Medicare Other | Attending: Oncology | Admitting: Oncology

## 2016-02-26 ENCOUNTER — Encounter: Payer: Self-pay | Admitting: Oncology

## 2016-02-26 ENCOUNTER — Inpatient Hospital Stay: Payer: Medicare Other

## 2016-02-26 VITALS — BP 172/93 | HR 102 | Temp 97.6°F | Resp 18 | Ht 64.25 in | Wt 143.1 lb

## 2016-02-26 DIAGNOSIS — J8410 Pulmonary fibrosis, unspecified: Secondary | ICD-10-CM | POA: Insufficient documentation

## 2016-02-26 DIAGNOSIS — Z171 Estrogen receptor negative status [ER-]: Secondary | ICD-10-CM | POA: Insufficient documentation

## 2016-02-26 DIAGNOSIS — C50912 Malignant neoplasm of unspecified site of left female breast: Secondary | ICD-10-CM

## 2016-02-26 DIAGNOSIS — E0500 Thyrotoxicosis with diffuse goiter without thyrotoxic crisis or storm: Secondary | ICD-10-CM | POA: Insufficient documentation

## 2016-02-26 DIAGNOSIS — Z853 Personal history of malignant neoplasm of breast: Secondary | ICD-10-CM | POA: Insufficient documentation

## 2016-02-26 DIAGNOSIS — Z923 Personal history of irradiation: Secondary | ICD-10-CM | POA: Diagnosis not present

## 2016-02-26 DIAGNOSIS — Z79899 Other long term (current) drug therapy: Secondary | ICD-10-CM | POA: Insufficient documentation

## 2016-02-26 DIAGNOSIS — Z9221 Personal history of antineoplastic chemotherapy: Secondary | ICD-10-CM | POA: Diagnosis not present

## 2016-02-26 DIAGNOSIS — M1990 Unspecified osteoarthritis, unspecified site: Secondary | ICD-10-CM | POA: Insufficient documentation

## 2016-02-26 DIAGNOSIS — I10 Essential (primary) hypertension: Secondary | ICD-10-CM | POA: Diagnosis not present

## 2016-02-26 NOTE — Progress Notes (Signed)
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 03-21-09 Echocardiogram LVEF 60 % is performed at [facility] 04/04/09- 05/15/09: AC dose dense X 4 cycles 06/05/09 - 09/03/09 taxol weekly X 12 09/24/09- 11/01/09: site treated left breast Tumor dose 5040 cGy in 28 fractions      Breast CA ([place])   03/21/2009 Initial Diagnosis    Breast CA ([facility])        Interval history- overall she is doing well and denies any complaints of aches or pains anywhere, loss of appetite or unintentional weight loss  ECOG PS- 0  Review of systems- Review of Systems  Constitutional: Negative for chills, fever, malaise/fatigue and weight loss.  HENT: Negative for congestion, ear discharge and nosebleeds.   Eyes: Negative for blurred vision.  Respiratory: Negative for cough, hemoptysis, sputum production, shortness of breath and wheezing.   Cardiovascular: Negative for chest pain, palpitations, orthopnea and claudication.  Gastrointestinal: Negative for abdominal pain, blood in stool, constipation, diarrhea, heartburn, melena, nausea and vomiting.  Genitourinary: Negative for dysuria, flank pain, frequency, hematuria and urgency.  Musculoskeletal: Negative for back pain, joint pain and myalgias.  Skin: Negative for rash.  Neurological: Negative for dizziness, tingling, focal weakness, seizures, weakness  and headaches.  Endo/Heme/Allergies: Does not bruise/bleed easily.  Psychiatric/Behavioral: Negative for depression and suicidal ideas. The patient does not have insomnia.    Breast exam was performed in seated and lying down position. Patient is status post right lumpectomy with a well-healed surgical scar. No evidence of any palpable masses. No evidence of axillary adenopathy. No evidence of any palpable masses or lumps in the left breast. No evidence of leftt axillary adenopathy   Current treatment- observation  Allergies  Allergen Reactions  . Sulfa Antibiotics Anaphylaxis and Rash  . Statins Other (See Comments)    Severe abdominal pain.     Past Medical History:  Diagnosis Date  . Arthritis   . Cancer [facility]) 2010   left breast   . Graves disease 1996  . Hypertension 1984  . Pulmonary fibrosis ([place]) 2012  . Thyroid disease      Past Surgical History:  Procedure Laterality Date  . ABDOMINAL HYSTERECTOMY  2001  . BREAST BIOPSY Left 2002  . BREAST SURGERY Left 2002   lumpectomy,   . COLONOSCOPY  2002  . COSMETIC SURGERY Left    implant 1977    Social History   Social History  . Marital status: Married    Spouse name: N/A  . Number of children: N/A  . Years of education: N/A   Occupational History  . Not on file.   Social History Main Topics  . Smoking status: Never Smoker  . Smokeless tobacco: Never Used  . Alcohol use Yes     Comment: 2 times a month   . Drug use: No  . Sexual activity: Not on file   Other Topics Concern  . Not  on file   Social History Narrative  . No narrative on file    Family History  Problem Relation Age of Onset  . Kidney disease Mother     Kidney failure/ received a kidney transplant when she was 102  . Breast cancer Daughter 54    genetic negative     Current Outpatient Prescriptions:  .  Cholecalciferol (VITAMIN D) 2000 UNITS CAPS, Take 2,000 Units by mouth daily. , Disp: , Rfl:  .  Coenzyme Q10 (CO Q10) 200 MG  CAPS, Take 200 mg by mouth daily. , Disp: , Rfl:  .  metoprolol succinate (TOPROL-XL) 25 MG 24 hr tablet, Take 1 tablet (25 mg total) by mouth daily. (Patient taking differently: Take 25 mg by mouth daily. 1/2 tablet each day), Disp: 90 tablet, Rfl: 3 .  Multiple Vitamins-Minerals (MULTIVITAMIN WITH MINERALS) tablet, Take 1 tablet by mouth daily., Disp: , Rfl:  .  Omega 3 1000 MG CAPS, Take 1,000 mg by mouth daily., Disp: , Rfl:   Physical exam:  Vitals:   02/26/16 1454  BP: (!) 172/93  Pulse: (!) 102  Resp: 18  Temp: 97.6 F (36.4 C)  TempSrc: Tympanic  Weight: 143 lb 1.3 oz (64.9 kg)  Height: 5' 4.25" (1.632 m)   Physical Exam  Constitutional: She is oriented to person, place, and time and well-developed, well-nourished, and in no distress.  HENT:  Head: Normocephalic and atraumatic.  Eyes: EOM are normal. Pupils are equal, round, and reactive to light.  Neck: Normal range of motion.  Cardiovascular: Normal rate, regular rhythm and normal heart sounds.   Pulmonary/Chest: Effort normal and breath sounds normal.  Abdominal: Soft. Bowel sounds are normal.  Neurological: She is alert and oriented to person, place, and time.  Skin: Skin is warm and dry.     CMP Latest Ref Rng & Units 05/11/2015  Glucose 65 - 99 mg/dL 105(H)  BUN 8 - 27 mg/dL 10  Creatinine 0.57 - 1.00 mg/dL 0.77  Sodium 134 - 144 mmol/L 146(H)  Potassium 3.5 - 5.2 mmol/L 5.0  Chloride 96 - 106 mmol/L 101  CO2 18 - 29 mmol/L 26  Calcium 8.7 - 10.3 mg/dL 9.8  Total Protein 6.0 - 8.5 g/dL 7.3  Total Bilirubin 0.0 - 1.2 mg/dL 0.5  Alkaline Phos 39 - 117 IU/L 83  AST 0 - 40 IU/L 28  ALT 0 - 32 IU/L 18   CBC Latest Ref Rng & Units 05/11/2015  WBC 3.4 - 10.8 x10E3/uL 4.1  Hemoglobin 12.0 - 16.0 g/dL -  Hematocrit 34.0 - 46.6 % 43.5  Platelets 150 - 379 x10E3/uL 230     Assessment and plan- Patient is a 71 y.o. female with a history of triple-negative stage I left breast cancer in 2011 status post lumpectomy  and adjuvant chemotherapy and radiation who sees [place] on a yearly basis  1. Clinical surveillance for breast cancer- patient is doing well and there is no clinical evidence of recurrence on today's exam. We will continue to see her on a yearly basis. She recently had an MRI of her bilateral breasts on 06/22/2015 which revealed no evidence of malignancy. She continues to f/u with Dr. Bary Castilla.    Visit Diagnosis 1. Malignant neoplasm of left breast in female, estrogen receptor negative, unspecified site of breast [facility])      Dr. Randa Evens, MD, MPH [place] [facility] Pager- [phone] 02/26/2016 2:26 PM

## 2016-02-26 NOTE — Progress Notes (Signed)
Notice pain of left breast at night in bed and sharp pains. It comes and goes.  Pt had [place] and mri and found nothing

## 2016-02-27 ENCOUNTER — Ambulatory Visit: Payer: No Typology Code available for payment source

## 2016-02-27 ENCOUNTER — Other Ambulatory Visit: Payer: No Typology Code available for payment source

## 2016-03-06 ENCOUNTER — Ambulatory Visit: Payer: Medicare Other | Admitting: Family Medicine

## 2016-04-02 DIAGNOSIS — C4401 Basal cell carcinoma of skin of lip: Secondary | ICD-10-CM | POA: Diagnosis not present

## 2016-04-02 DIAGNOSIS — L578 Other skin changes due to chronic exposure to nonionizing radiation: Secondary | ICD-10-CM | POA: Diagnosis not present

## 2016-04-02 DIAGNOSIS — L814 Other melanin hyperpigmentation: Secondary | ICD-10-CM | POA: Diagnosis not present

## 2016-04-02 DIAGNOSIS — L908 Other atrophic disorders of skin: Secondary | ICD-10-CM | POA: Diagnosis not present

## 2016-05-07 ENCOUNTER — Ambulatory Visit (INDEPENDENT_AMBULATORY_CARE_PROVIDER_SITE_OTHER): Payer: Medicare Other | Admitting: Family Medicine

## 2016-05-07 ENCOUNTER — Encounter: Payer: Self-pay | Admitting: Family Medicine

## 2016-05-07 VITALS — BP 124/78 | HR 84 | Temp 97.6°F | Resp 16 | Wt 139.0 lb

## 2016-05-07 DIAGNOSIS — R5383 Other fatigue: Secondary | ICD-10-CM

## 2016-05-07 DIAGNOSIS — I10 Essential (primary) hypertension: Secondary | ICD-10-CM

## 2016-05-07 DIAGNOSIS — R319 Hematuria, unspecified: Secondary | ICD-10-CM | POA: Diagnosis not present

## 2016-05-07 DIAGNOSIS — N3001 Acute cystitis with hematuria: Secondary | ICD-10-CM | POA: Diagnosis not present

## 2016-05-07 DIAGNOSIS — E7800 Pure hypercholesterolemia, unspecified: Secondary | ICD-10-CM | POA: Diagnosis not present

## 2016-05-07 LAB — POCT URINALYSIS DIPSTICK
Bilirubin, UA: NEGATIVE
GLUCOSE UA: NEGATIVE
Nitrite, UA: NEGATIVE
Protein, UA: NEGATIVE
Spec Grav, UA: 1.015
Urobilinogen, UA: 0.2
pH, UA: 6.5

## 2016-05-07 MED ORDER — METOPROLOL SUCCINATE ER 25 MG PO TB24: 37.5000 mg | ORAL_TABLET | Freq: Every day | ORAL | 3 refills | Status: DC

## 2016-05-07 NOTE — Progress Notes (Signed)
[name]    DOB: June 23, 1945   71 y.o.   MRN: [id number] Visit Date: 05/07/2016  Today's Provider: Wilhemena Durie, MD   Chief Complaint  Patient presents with  . Hyperlipidemia  . Hypertension  . Hematuria   Subjective:    HPI      Lipid/Cholesterol, Follow-up:   Last seen for this11 months ago.  Management changes since that visit include discussing adding Crestor 10 mg. Pt is not taking this medication. Pt reports she has a family H/O hyperlipidemia, but no family H/O heart disease. Pt is also concerned about taking Crestor because it caused pancreatic failure in her husband. . Last Lipid Panel:    Component Value Date/Time   CHOL 320 (H) 05/11/2015 0954   TRIG 85 05/11/2015 0954   HDL 81 05/11/2015 0954   LDLCALC 222 (H) 05/11/2015 0954    Current diet: in general, a "healthy" diet     Wt Readings from Last 3 Encounters:  05/07/16 139 lb (63 kg)  02/26/16 143 lb 1.3 oz (64.9 kg)  10/31/15 139 lb (63 kg)    -------------------------------------------------------------------  Hypertension, follow-up:  BP Readings from Last 3 Encounters:  05/07/16 124/78  02/26/16 (!) 172/93  10/31/15 (!) 162/90    She was last seen for hypertension 6 months ago.  BP at that visit was 162/90. Management since that visit includes none. She reports excellent compliance with treatment. Pt needs refills of Metoprolol. She is not having side effects.  She is exercising. 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 She is adherent to low salt diet.   Outside blood pressures are better than what they read while in doctor's office per pt. They are 120's-130's/80's-90's. She is experiencing fatigue.  Patient denies chest pain, chest pressure/discomfort, claudication, dyspnea, exertional chest pressure/discomfort, irregular heart beat, lower extremity edema, near-syncope, orthopnea, palpitations and syncope.   Cardiovascular  risk factors include advanced age (older than 59 for men, 32 for women), dyslipidemia and hypertension.      Weight trend: stable Wt Readings from Last 3 Encounters:  05/07/16 139 lb (63 kg)  02/26/16 143 lb 1.3 oz (64.9 kg)  10/31/15 139 lb (63 kg)    ------------------------------------------------------------------------ Hematuria Pt has noticed this problem intermittently for about 6 weeks. Urine is reddish-brown and dark yellow. "Squeezing" sensation on right flank. Denies dysuria, frequency, urgency. Pt does note occasional chills.  Allergies  Allergen Reactions  . Sulfa Antibiotics Anaphylaxis and Rash  . Statins Other (See Comments)    Severe abdominal pain.     Current Outpatient Prescriptions:  .  metoprolol succinate (TOPROL-XL) 25 MG 24 hr tablet, Take 1.5 tablets (37.5 mg total) by mouth daily., Disp: 135 tablet, Rfl: 3 .  Cholecalciferol (VITAMIN D) 2000 UNITS CAPS, Take 2,000 Units by mouth daily. , Disp: , Rfl:  .  Coenzyme Q10 (CO Q10) 200 MG CAPS, Take 200 mg by mouth daily. , Disp: , Rfl:  .  Multiple Vitamins-Minerals (MULTIVITAMIN WITH MINERALS) tablet, Take 1 tablet by mouth daily., Disp: , Rfl:  .  Omega 3 1000 MG CAPS, Take 1,000 mg by mouth daily., Disp: , Rfl:   Review of Systems  Constitutional: Positive for chills and fatigue. Negative for activity change, appetite change, diaphoresis, fever and unexpected weight change.  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   Cardiovascular: Negative for chest pain, palpitations and leg swelling.  Endocrine:  Positive for cold intolerance.  Genitourinary: Positive for flank pain and hematuria. Negative for difficulty urinating, dysuria, frequency and urgency.    Social History  Substance Use Topics  . Smoking status: Never Smoker  . Smokeless tobacco: Never Used  . Alcohol use Yes     Comment: 2 times a month    Objective:   BP 124/78 (BP  Location: Left Arm, Cuff Size: Normal)   Pulse 84   Temp 97.6 F (36.4 C) (Oral)   Resp 16   Wt 139 lb (63 kg)   BMI 23.67 kg/m  Vitals:   05/07/16 1112 05/07/16 1144  BP: (!) 152/82 124/78  Pulse: 84   Resp: 16   Temp: 97.6 F (36.4 C)   TempSrc: Oral   Weight: 139 lb (63 kg)      Physical Exam  Constitutional: She is oriented to person, place, and time. She appears well-developed and well-nourished.  HENT:  Head: Normocephalic and atraumatic.  Right Ear: External ear normal.  Left Ear: External ear normal.  Nose: Nose normal.  Eyes: Conjunctivae are normal.  Neck:  Mild goitre  Cardiovascular: Normal rate, regular rhythm and normal heart sounds.   Pulmonary/Chest: Effort normal and breath sounds normal. No respiratory distress.  Abdominal: Soft.  Neurological: She is alert and oriented to person, place, and time.  Skin: Skin is warm and dry.  Psychiatric: She has a normal mood and affect. Her behavior is normal. Judgment and thought content normal.        Assessment & Plan:     1. Hematuria, unspecified type  - POCT urinalysis dipstick Results for orders placed or performed in visit on 05/07/16  POCT urinalysis dipstick  Result Value Ref Range   Color, UA amber    Clarity, UA clear    Glucose, UA Negative    Bilirubin, UA Negative    Ketones, UA trace    Spec Grav, UA 1.015 1.003, 1.005, 1.010, 1.015, 1.020, 1.025, 1.030, 1.035   Blood, UA Moderate    pH, UA 6.5 5.0, 5.5, 6.0, 6.5, 7.0, 7.5, 8.0   Protein, UA Negative    Urobilinogen, UA 0.2 0.2, 1.0   Nitrite, UA Negative    Leukocytes, UA Trace (A) Negative     2. Acute cystitis with hematuria Send for culture. Increase fluids. Will treat if positive for infection.  - Urine culture  3. Essential (primary) hypertension Not to goal Will increase Metoprolol to 37.5 mg po qd. FU 1-2 months. - metoprolol succinate (TOPROL-XL) 25 MG 24 hr tablet; Take 1.5 tablets (37.5 mg total) by mouth daily.   Dispense: 135 tablet; Refill: 3 - CBC with Differential/Platelet - Comprehensive metabolic panel  4. Hypercholesteremia Will check labs. FU pending results.No FH of any ASCVD and pt does not want to take any statins. - TSH - Lipid panel  5.h/o Breast Cancer 6.Mild Goiter  5. Other fatigue Also with cold intolerance. Will check TSH as above. FU pending results.    Patient seen and examined by Miguel Aschoff, MD, and note scribed by Renaldo Fiddler, CMA. I have done the exam and reviewed the above chart and it is accurate to the best of my knowledge. Development worker, community has been used in this note in any air is in the dictation or transcription are unintentional.  Wilhemena Durie, MD  [place]

## 2016-05-07 NOTE — Patient Instructions (Signed)
Drink plenty of water. We are sending urine for culture and sensitivity. If results are positive urinary infection, we will treat with antibiotics.

## 2016-05-08 ENCOUNTER — Telehealth: Payer: Self-pay | Admitting: Family Medicine

## 2016-05-08 LAB — CBC WITH DIFFERENTIAL/PLATELET
Basophils Absolute: 0 10*3/uL (ref 0.0–0.2)
Basos: 0 %
EOS (ABSOLUTE): 0 10*3/uL (ref 0.0–0.4)
EOS: 0 %
HEMATOCRIT: 41.9 % (ref 34.0–46.6)
HEMOGLOBIN: 14.2 g/dL (ref 11.1–15.9)
IMMATURE GRANS (ABS): 0 10*3/uL (ref 0.0–0.1)
Immature Granulocytes: 0 %
LYMPHS ABS: 1.3 10*3/uL (ref 0.7–3.1)
LYMPHS: 25 %
MCH: 30.6 pg (ref 26.6–33.0)
MCHC: 33.9 g/dL (ref 31.5–35.7)
MCV: 90 fL (ref 79–97)
MONOCYTES: 8 %
Monocytes Absolute: 0.4 10*3/uL (ref 0.1–0.9)
Neutrophils Absolute: 3.5 10*3/uL (ref 1.4–7.0)
Neutrophils: 67 %
Platelets: 234 10*3/uL (ref 150–379)
RBC: 4.64 x10E6/uL (ref 3.77–5.28)
RDW: 13.6 % (ref 12.3–15.4)
WBC: 5.2 10*3/uL (ref 3.4–10.8)

## 2016-05-08 LAB — LIPID PANEL
CHOL/HDL RATIO: 3.8 ratio (ref 0.0–4.4)
Cholesterol, Total: 289 mg/dL — ABNORMAL HIGH (ref 100–199)
HDL: 76 mg/dL (ref >39)
LDL CALC: 195 mg/dL — AB (ref 0–99)
Triglycerides: 89 mg/dL (ref 0–149)
VLDL CHOLESTEROL CAL: 18 mg/dL (ref 5–40)

## 2016-05-08 LAB — COMPREHENSIVE METABOLIC PANEL
ALBUMIN: 4.5 g/dL (ref 3.5–4.8)
ALK PHOS: 81 IU/L (ref 39–117)
ALT: 22 IU/L (ref 0–32)
AST: 29 IU/L (ref 0–40)
Albumin/Globulin Ratio: 1.8 (ref 1.2–2.2)
BUN / CREAT RATIO: 11 — AB (ref 12–28)
BUN: 9 mg/dL (ref 8–27)
Bilirubin Total: 0.4 mg/dL (ref 0.0–1.2)
CALCIUM: 9.4 mg/dL (ref 8.7–10.3)
CO2: 28 mmol/L (ref 18–29)
CREATININE: 0.79 mg/dL (ref 0.57–1.00)
Chloride: 99 mmol/L (ref 96–106)
GFR, EST AFRICAN AMERICAN: 86 mL/min/{1.73_m2} (ref >59)
GFR, EST NON AFRICAN AMERICAN: 75 mL/min/{1.73_m2} (ref >59)
GLOBULIN, TOTAL: 2.5 g/dL (ref 1.5–4.5)
Glucose: 102 mg/dL — ABNORMAL HIGH (ref 65–99)
Potassium: 4.4 mmol/L (ref 3.5–5.2)
SODIUM: 140 mmol/L (ref 134–144)
TOTAL PROTEIN: 7 g/dL (ref 6.0–8.5)

## 2016-05-08 LAB — URINE CULTURE: Organism ID, Bacteria: NO GROWTH

## 2016-05-08 LAB — TSH: TSH: 0.786 u[IU]/mL (ref 0.450–4.500)

## 2016-05-08 NOTE — Telephone Encounter (Signed)
-----   Message from Jerrol Banana., MD sent at 05/08/2016  8:31 AM EDT ----- Stable.

## 2016-05-08 NOTE — Telephone Encounter (Signed)
Patient advised.

## 2016-05-08 NOTE — Telephone Encounter (Signed)
Pt is returning call.  CB#[phone]/MW

## 2016-08-25 DIAGNOSIS — D2261 Melanocytic nevi of right upper limb, including shoulder: Secondary | ICD-10-CM | POA: Diagnosis not present

## 2016-08-25 DIAGNOSIS — D225 Melanocytic nevi of trunk: Secondary | ICD-10-CM | POA: Diagnosis not present

## 2016-08-25 DIAGNOSIS — D2272 Melanocytic nevi of left lower limb, including hip: Secondary | ICD-10-CM | POA: Diagnosis not present

## 2016-08-25 DIAGNOSIS — Z85828 Personal history of other malignant neoplasm of skin: Secondary | ICD-10-CM | POA: Diagnosis not present

## 2016-11-06 ENCOUNTER — Ambulatory Visit: Payer: Medicare Other | Admitting: Family Medicine

## 2016-11-24 ENCOUNTER — Ambulatory Visit: Payer: Medicare Other | Admitting: Family Medicine

## 2016-12-09 ENCOUNTER — Telehealth: Payer: Self-pay | Admitting: Family Medicine

## 2016-12-16 NOTE — Telephone Encounter (Signed)
Pt returned call. Pt is scheduled for AWV and F/U on 01/20/17. Thanks TNP

## 2017-01-09 ENCOUNTER — Telehealth: Payer: Self-pay | Admitting: Family Medicine

## 2017-01-20 ENCOUNTER — Ambulatory Visit (INDEPENDENT_AMBULATORY_CARE_PROVIDER_SITE_OTHER): Payer: Medicare Other | Admitting: Family Medicine

## 2017-01-20 ENCOUNTER — Ambulatory Visit
Admission: RE | Admit: 2017-01-20 | Discharge: 2017-01-20 | Disposition: A | Discharge status: Home / Self Care | Payer: Medicare Other | Source: Ambulatory Visit | Attending: Family Medicine | Admitting: Family Medicine

## 2017-01-20 ENCOUNTER — Ambulatory Visit: Payer: Medicare Other

## 2017-01-20 ENCOUNTER — Ambulatory Visit (INDEPENDENT_AMBULATORY_CARE_PROVIDER_SITE_OTHER): Payer: Medicare Other

## 2017-01-20 ENCOUNTER — Encounter: Payer: Self-pay | Admitting: Family Medicine

## 2017-01-20 VITALS — BP 164/82 | HR 80 | Temp 98.4°F | Wt 136.6 lb

## 2017-01-20 VITALS — BP 168/94 | HR 80 | Temp 98.4°F | Ht 64.0 in | Wt 136.6 lb

## 2017-01-20 DIAGNOSIS — Z23 Encounter for immunization: Secondary | ICD-10-CM

## 2017-01-20 DIAGNOSIS — M5136 Other intervertebral disc degeneration, lumbar region: Secondary | ICD-10-CM | POA: Diagnosis not present

## 2017-01-20 DIAGNOSIS — M25551 Pain in right hip: Secondary | ICD-10-CM | POA: Diagnosis not present

## 2017-01-20 DIAGNOSIS — Z1159 Encounter for screening for other viral diseases: Secondary | ICD-10-CM

## 2017-01-20 DIAGNOSIS — Z0000 Encounter for general adult medical examination without abnormal findings: Secondary | ICD-10-CM

## 2017-01-20 DIAGNOSIS — M545 Low back pain: Secondary | ICD-10-CM | POA: Diagnosis not present

## 2017-01-20 DIAGNOSIS — I10 Essential (primary) hypertension: Secondary | ICD-10-CM

## 2017-01-20 MED ORDER — PREDNISONE 10 MG (21) PO TBPK
ORAL_TABLET | ORAL | 0 refills | Status: DC
Start: 2017-01-20 — End: 2017-08-12

## 2017-01-20 MED ORDER — CYCLOBENZAPRINE HCL 10 MG PO TABS: 10.0000 mg | ORAL_TABLET | Freq: Every day | ORAL | 0 refills | Status: DC

## 2017-01-20 NOTE — Progress Notes (Signed)
Subjective:   Anna Mcmillan is a 71 y.o. female who presents for Medicare Annual (Subsequent) preventive examination.  Review of Systems:  N/A  Cardiac Risk Factors include: advanced age (>3men, >65 women);dyslipidemia;hypertension     Objective:     Vitals: BP (!) 168/94 (BP Location: Left Arm)   Pulse 80   Temp 98.4 F (36.9 C) (Oral)   Ht 5\' 4"  (1.626 m)   Wt 136 lb 9.6 oz (62 kg)   BMI 23.45 kg/m   Body mass index is 23.45 kg/m.   Tobacco Social History   Tobacco Use  Smoking Status Never Smoker  Smokeless Tobacco Never Used     Counseling given: Not Answered   Past Medical History:  Diagnosis Date  . Arthritis   . Cancer [facility]) 2010   left breast   . Graves disease 1996  . Hypertension 1984  . Pulmonary fibrosis ([place]) 2012  . Thyroid disease    Past Surgical History:  Procedure Laterality Date  . ABDOMINAL HYSTERECTOMY  2001  . BREAST BIOPSY Left 2002  . BREAST SURGERY Left 2002   lumpectomy,   . COLONOSCOPY  2002  . COSMETIC SURGERY Left    implant 1977   Family History  Problem Relation Age of Onset  . Kidney disease Mother        Kidney failure/ received a kidney transplant when she was 42  . Breast cancer Daughter 64       genetic negative   Social History   Substance and Sexual Activity  Sexual Activity Not on file    Outpatient Encounter Medications as of 01/20/2017  Medication Sig  . acetaminophen (TYLENOL) 325 MG tablet Take 325 mg by mouth every 4 (four) hours as needed.  Marland Kitchen acetaminophen (TYLENOL) 500 MG tablet Take 500 mg by mouth every 4 (four) hours as needed.  . Coenzyme Q10 (CO Q10) 200 MG CAPS Take 200 mg by mouth daily.   . metoprolol succinate (TOPROL-XL) 25 MG 24 hr tablet Take 1.5 tablets (37.5 mg total) by mouth daily. (Patient taking differently: Take 25 mg by mouth daily. )  . Multiple Vitamins-Minerals (MULTIVITAMIN WITH MINERALS) tablet Take 1 tablet by mouth daily.   . Omega 3 1000 MG CAPS Take 1,000 mg by  mouth daily.  . [DISCONTINUED] Cholecalciferol (VITAMIN D) 2000 UNITS CAPS Take 2,000 Units by mouth daily.    No facility-administered encounter medications on file as of 01/20/2017.     Activities of Daily Living In your present state of health, do you have any difficulty performing the following activities: 01/20/2017  Hearing? N  Vision? N  Difficulty concentrating or making decisions? N  Walking or climbing stairs? N  Dressing or bathing? N  Doing errands, shopping? N  Preparing Food and eating ? N  Using the Toilet? N  In the past six months, have you accidently leaked urine? N  Do you have problems with loss of bowel control? N  Managing your Medications? N  Managing your Finances? N  Housekeeping or managing your Housekeeping? N  Some recent data might be hidden    Patient Care Team: Jerrol Banana., MD as PCP - General (Family Medicine)    Assessment:     Exercise Activities and Dietary recommendations Current Exercise Habits: The patient does not participate in regular exercise at present(does a lot of house work and has stairs in home)  Goals    . DIET - INCREASE WATER INTAKE  Recommend increasing water intake to 4-6 glasses of water a day.       Fall Risk Fall Risk  01/20/2017 10/31/2015 05/01/2015 02/16/2015  Falls in the past year? No No No Yes  Number falls in past yr: - - - (No Data)  Comment - - - N/A  Injury with Fall? - - - (No Data)  Comment - - - N/A   Depression Screen PHQ 2/9 Scores 01/20/2017 10/31/2015 05/01/2015  PHQ - 2 Score 0 0 0     Cognitive Function: Pt declined screening today.        Immunization History  Administered Date(s) Administered  . Influenza, High Dose Seasonal PF 11/22/2014, 10/31/2015, 01/20/2017  . Influenza-Unspecified 11/24/2013  . Pneumococcal Conjugate-13 05/24/2014  . Pneumococcal Polysaccharide-23 02/08/2013  . Tdap 09/24/2005   Screening Tests Health Maintenance  Topic Date Due  . Hepatitis C  Screening  April 12, 1945  . TETANUS/TDAP  09/25/2015  . MAMMOGRAM  01/22/2018  . COLONOSCOPY  12/15/2022  . INFLUENZA VACCINE  Completed  . DEXA SCAN  Completed  . PNA vac Low Risk Adult  Completed      Plan:  I have personally reviewed and addressed the Medicare Annual Wellness questionnaire and have noted the following in the patient's chart:  A. Medical and social history B. Use of alcohol, tobacco or illicit drugs  C. Current medications and supplements D. Functional ability and status E.  Nutritional status F.  Physical activity G. Advance directives H. List of other physicians I.  Hospitalizations, surgeries, and ER visits in previous 12 months J.  [place] such as hearing and vision if needed, cognitive and depression L. Referrals and appointments - none  In addition, I have reviewed and discussed with patient certain preventive protocols, quality metrics, and best practice recommendations. A written personalized care plan for preventive services as well as general preventive health recommendations were provided to patient.  See attached scanned questionnaire for additional information.   Signed,  Fabio Neighbors, LPN Nurse Health Advisor   MD Recommendations: Pt declined the tetanus vaccine today. Needs BP rechecked after apt with doctor.

## 2017-01-20 NOTE — Progress Notes (Signed)
[name]    DOB: [birth year]   71 y.o.   MRN: [id number] Visit Date: 01/20/2017  Today's Provider: Wilhemena Durie, MD   Chief Complaint  Patient presents with  . Hypertension   Subjective:    HPI  Hypertension, follow-up:  BP Readings from Last 3 Encounters:  01/20/17 (!) 164/82  01/20/17 (!) 168/94  05/07/16 124/78    She was last seen for hypertension 8 months ago.  BP at that visit was 124/78. Management since that visit includes none. She reports good compliance with treatment. She is not having side effects.  She is not exercising, formally but is very active. She is adherent to low salt diet.   Outside blood pressures are 120-130's/70-80's. She is experiencing dyspnea. She thinks it is her activity level. She does not have SOB when walking to the mail box or anything but has it when she is very busy doing things. Patient denies chest pain, chest pressure/discomfort, claudication, exertional chest pressure/discomfort, fatigue, irregular heart beat, lower extremity edema, near-syncope, orthopnea, palpitations, paroxysmal nocturnal dyspnea, syncope and tachypnea.   Cardiovascular risk factors include advanced age (older than 64 for men, 67 for women) and hypertension.   Wt Readings from Last 3 Encounters:  01/20/17 136 lb 9.6 oz (62 kg)  01/20/17 136 lb 9.6 oz (62 kg)  05/07/16 139 lb (63 kg)   ------------------------------------------------------------------------  Pt reports that she is having hip pain. It started about 3 days ago when she was moving furniture. She reports that the hip immediately felt sore but the pain started later that night. The pain is keeping her from sleeping. She reports that she has pain in her pelvic area and right hip. She reports that it is sharp pain in her lower back. She feels like she has muscle cramps in her right inner thigh. She reports that at baseline it is like a dull ache but when she tried to move  it is a sharp pain. She reports that she has been taking a lot of tylenol. She has been taking 1 500 mg tylenol every 2 hours. She used a heating pad to her lower back this morning and was able to get some relief.      Allergies  Allergen Reactions  . Sulfa Antibiotics Anaphylaxis and Rash  . Statins Other (See Comments)    Severe abdominal pain.     Current Outpatient Medications:  .  acetaminophen (TYLENOL) 325 MG tablet, Take 325 mg by mouth every 4 (four) hours as needed., Disp: , Rfl:  .  acetaminophen (TYLENOL) 500 MG tablet, Take 500 mg by mouth every 4 (four) hours as needed., Disp: , Rfl:  .  Coenzyme Q10 (CO Q10) 200 MG CAPS, Take 200 mg by mouth daily. , Disp: , Rfl:  .  metoprolol succinate (TOPROL-XL) 25 MG 24 hr tablet, Take 1.5 tablets (37.5 mg total) by mouth daily. (Patient taking differently: Take 25 mg by mouth daily. ), Disp: 135 tablet, Rfl: 3 .  Multiple Vitamins-Minerals (MULTIVITAMIN WITH MINERALS) tablet, Take 1 tablet by mouth daily. , Disp: , Rfl:  .  Omega 3 1000 MG CAPS, Take 1,000 mg by mouth daily., Disp: , Rfl:  .  cyclobenzaprine (FLEXERIL) 10 MG tablet, Take 1 tablet (10 mg total) by mouth at bedtime., Disp: 30 tablet, Rfl: 0 .  predniSONE (STERAPRED UNI-PAK 21 TAB) 10 MG (21) TBPK tablet, Take 6 on the first day then decrease by 1 daily  until finished., Disp: 21 tablet, Rfl: 0  Review of Systems  Constitutional: Positive for fatigue.  HENT: Negative.   Eyes: Negative.   Respiratory: Negative.   Cardiovascular: Negative.   Gastrointestinal: Negative.   Endocrine: Negative.   Genitourinary: Positive for pelvic pain.  Musculoskeletal: Positive for arthralgias.  Skin: Negative.   Allergic/Immunologic: Negative.   Neurological: Negative.   Hematological: Negative.   Psychiatric/Behavioral: Negative.     Social History   Tobacco Use  . Smoking status: Never Smoker  . Smokeless tobacco: Never Used  Substance Use Topics  . Alcohol use: Yes     Comment: ocassionally wine   Objective:   BP (!) 164/82   Pulse 80   Temp 98.4 F (36.9 C) (Oral)   Wt 136 lb 9.6 oz (62 kg)   BMI 23.45 kg/m  Vitals:   01/20/17 1336 01/20/17 1355  BP: (!) 168/94 (!) 164/82  Pulse: 80   Temp: 98.4 F (36.9 C)   TempSrc: Oral   Weight: 136 lb 9.6 oz (62 kg)      Physical Exam  Constitutional: She is oriented to person, place, and time. She appears well-developed and well-nourished.  Eyes: Conjunctivae and EOM are normal. Pupils are equal, round, and reactive to light.  Neck: Normal range of motion. Neck supple.  Pulmonary/Chest: Effort normal and breath sounds normal.  Musculoskeletal: Normal range of motion.  Pain with figure four. Negative leg raises.   Neurological: She is alert and oriented to person, place, and time. She has normal reflexes.  Skin: Skin is warm and dry.  Psychiatric: She has a normal mood and affect. Her behavior is normal. Judgment and thought content normal.        Assessment & Plan:     1. Essential (primary) hypertension Elevated today, but good at home. elevation may be due to pain in the hip.   2. Hip pain, acute, right  - DG HIP UNILAT WITH PELVIS 2-3 VIEWS RIGHT; Future - DG Lumbar Spine Complete; Future - predniSONE (STERAPRED UNI-PAK 21 TAB) 10 MG (21) TBPK tablet; Take 6 on the first day then decrease by 1 daily until finished.  Dispense: 21 tablet; Refill: 0 - cyclobenzaprine (FLEXERIL) 10 MG tablet; Take 1 tablet (10 mg total) by mouth at bedtime.  Dispense: 30 tablet; Refill: 0 3.Breast Cancer     HPI, Exam, and A&P Transcribed under the direction and in the presence of Richard L. Cranford Mon, MD  Electronically Signed: Katina Dung, CMA  I have done the exam and reviewed the above chart and it is accurate to the best of my knowledge. Development worker, community has been used in this note in any air is in the dictation or transcription are unintentional.  Wilhemena Durie, MD  [place]

## 2017-01-20 NOTE — Patient Instructions (Signed)
Anna Mcmillan , Thank you for taking time to come for your Medicare Wellness Visit. I appreciate your ongoing commitment to your health goals. Please review the following plan we discussed and let me know if I can assist you in the future.   Screening recommendations/referrals: Colonoscopy: Up to date Mammogram: Up to date Bone Density: Up to date Recommended yearly ophthalmology/optometry visit for glaucoma screening and checkup Recommended yearly dental visit for hygiene and checkup  Vaccinations: Influenza vaccine: completed Pneumococcal vaccine: completed series Tdap vaccine: declined Shingles vaccine: declined  Advanced directives: Please bring a copy of your POA (Power of Attorney) and/or Living Will to your next appointment.   Conditions/risks identified: Recommend increasing water intake to 4-6 glasses of water a day.   Next appointment: 1:45 PM   Preventive Care 65 Years and Older, Female Preventive care refers to lifestyle choices and visits with your health care provider that can promote health and wellness. What does preventive care include?  A yearly physical exam. This is also called an annual well check.  Dental exams once or twice a year.  Routine eye exams. Ask your health care provider how often you should have your eyes checked.  Personal lifestyle choices, including:  Daily care of your teeth and gums.  Regular physical activity.  Eating a healthy diet.  Avoiding tobacco and drug use.  Limiting alcohol use.  Practicing safe sex.  Taking low-dose aspirin every day.  Taking vitamin and mineral supplements as recommended by your health care provider. What happens during an annual well check? The services and screenings done by your health care provider during your annual well check will depend on your age, overall health, lifestyle risk factors, and family history of disease. Counseling  Your health care provider may ask you questions about  your:  Alcohol use.  Tobacco use.  Drug use.  Emotional well-being.  Home and relationship well-being.  Sexual activity.  Eating habits.  History of falls.  Memory and ability to understand (cognition).  Work and work Statistician.  Reproductive health. Screening  You may have the following tests or measurements:  Height, weight, and BMI.  Blood pressure.  Lipid and cholesterol levels. These may be checked every 5 years, or more frequently if you are over 55 years old.  Skin check.  Lung cancer screening. You may have this screening every year starting at age 84 if you have a 30-pack-year history of smoking and currently smoke or have quit within the past 15 years.  Fecal occult blood test (FOBT) of the stool. You may have this test every year starting at age 49.  Flexible sigmoidoscopy or colonoscopy. You may have a sigmoidoscopy every 5 years or a colonoscopy every 10 years starting at age 45.  Hepatitis C blood test.  Hepatitis B blood test.  Sexually transmitted disease (STD) testing.  Diabetes screening. This is done by checking your blood sugar (glucose) after you have not eaten for a while (fasting). You may have this done every 1-3 years.  Bone density scan. This is done to screen for osteoporosis. You may have this done starting at age 67.  Mammogram. This may be done every 1-2 years. Talk to your health care provider about how often you should have regular mammograms. Talk with your health care provider about your test results, treatment options, and if necessary, the need for more tests. Vaccines  Your health care provider may recommend certain vaccines, such as:  Influenza vaccine. This is recommended every year.  Tetanus, diphtheria, and acellular pertussis (Tdap, Td) vaccine. You may need a Td booster every 10 years.  Zoster vaccine. You may need this after age 82.  Pneumococcal 13-valent conjugate (PCV13) vaccine. One dose is recommended  after age 74.  Pneumococcal polysaccharide (PPSV23) vaccine. One dose is recommended after age 59. Talk to your health care provider about which screenings and vaccines you need and how often you need them. This information is not intended to replace advice given to you by your health care provider. Make sure you discuss any questions you have with your health care provider. Document Released: 03/09/2015 Document Revised: 10/31/2015 Document Reviewed: 12/12/2014 Elsevier Interactive Patient Education  2017 [place] Prevention in the Home Falls can cause injuries. They can happen to people of all ages. There are many things you can do to make your home safe and to help prevent falls. What can I do on the outside of my home?  Regularly fix the edges of walkways and driveways and fix any cracks.  Remove anything that might make you trip as you walk through a door, such as a raised step or threshold.  Trim any bushes or trees on the path to your home.  Use bright outdoor lighting.  Clear any walking paths of anything that might make someone trip, such as rocks or tools.  Regularly check to see if handrails are loose or broken. Make sure that both sides of any steps have handrails.  Any raised decks and porches should have guardrails on the edges.  Have any leaves, snow, or ice cleared regularly.  Use sand or salt on walking paths during winter.  Clean up any spills in your garage right away. This includes oil or grease spills. What can I do in the bathroom?  Use night lights.  Install grab bars by the toilet and in the tub and shower. Do not use towel bars as grab bars.  Use non-skid mats or decals in the tub or shower.  If you need to sit down in the shower, use a plastic, non-slip stool.  Keep the floor dry. Clean up any water that spills on the floor as soon as it happens.  Remove soap buildup in the tub or shower regularly.  Attach bath mats securely with  double-sided non-slip rug tape.  Do not have throw rugs and other things on the floor that can make you trip. What can I do in the bedroom?  Use night lights.  Make sure that you have a light by your bed that is easy to reach.  Do not use any sheets or blankets that are too big for your bed. They should not hang down onto the floor.  Have a firm chair that has side arms. You can use this for support while you get dressed.  Do not have throw rugs and other things on the floor that can make you trip. What can I do in the kitchen?  Clean up any spills right away.  Avoid walking on wet floors.  Keep items that you use a lot in easy-to-reach places.  If you need to reach something above you, use a strong step stool that has a grab bar.  Keep electrical cords out of the way.  Do not use floor polish or wax that makes floors slippery. If you must use wax, use non-skid floor wax.  Do not have throw rugs and other things on the floor that can make you trip. What can I do  with my stairs?  Do not leave any items on the stairs.  Make sure that there are handrails on both sides of the stairs and use them. Fix handrails that are broken or loose. Make sure that handrails are as long as the stairways.  Check any carpeting to make sure that it is firmly attached to the stairs. Fix any carpet that is loose or worn.  Avoid having throw rugs at the top or bottom of the stairs. If you do have throw rugs, attach them to the floor with carpet tape.  Make sure that you have a light switch at the top of the stairs and the bottom of the stairs. If you do not have them, ask someone to add them for you. What else can I do to help prevent falls?  Wear shoes that:  Do not have high heels.  Have rubber bottoms.  Are comfortable and fit you well.  Are closed at the toe. Do not wear sandals.  If you use a stepladder:  Make sure that it is fully opened. Do not climb a closed stepladder.  Make  sure that both sides of the stepladder are locked into place.  Ask someone to hold it for you, if possible.  Clearly mark and make sure that you can see:  Any grab bars or handrails.  First and last steps.  Where the edge of each step is.  Use tools that help you move around (mobility aids) if they are needed. These include:  Canes.  Walkers.  Scooters.  Crutches.  Turn on the lights when you go into a dark area. Replace any light bulbs as soon as they burn out.  Set up your furniture so you have a clear path. Avoid moving your furniture around.  If any of your floors are uneven, fix them.  If there are any pets around you, be aware of where they are.  Review your medicines with your doctor. Some medicines can make you feel dizzy. This can increase your chance of falling. Ask your doctor what other things that you can do to help prevent falls. This information is not intended to replace advice given to you by your health care provider. Make sure you discuss any questions you have with your health care provider. Document Released: 12/07/2008 Document Revised: 07/19/2015 Document Reviewed: 03/17/2014 Elsevier Interactive Patient Education  2017 [employer].

## 2017-01-27 DIAGNOSIS — Z1231 Encounter for screening mammogram for malignant neoplasm of breast: Secondary | ICD-10-CM | POA: Diagnosis not present

## 2017-01-27 DIAGNOSIS — Z853 Personal history of malignant neoplasm of breast: Secondary | ICD-10-CM | POA: Diagnosis not present

## 2017-01-27 LAB — HM MAMMOGRAPHY

## 2017-02-11 NOTE — Telephone Encounter (Signed)
Visit complete.

## 2017-02-15 ENCOUNTER — Other Ambulatory Visit: Payer: Self-pay | Admitting: Family Medicine

## 2017-02-15 DIAGNOSIS — M25551 Pain in right hip: Secondary | ICD-10-CM

## 2017-02-26 ENCOUNTER — Inpatient Hospital Stay: Payer: Medicare Other | Admitting: Oncology

## 2017-07-15 ENCOUNTER — Other Ambulatory Visit: Payer: Self-pay | Admitting: Family Medicine

## 2017-07-15 DIAGNOSIS — I10 Essential (primary) hypertension: Secondary | ICD-10-CM

## 2017-07-20 ENCOUNTER — Telehealth: Payer: Self-pay | Admitting: Family Medicine

## 2017-07-20 NOTE — Telephone Encounter (Signed)
Left message for patient to return my call for an appt.

## 2017-07-21 ENCOUNTER — Ambulatory Visit: Payer: Self-pay | Admitting: Family Medicine

## 2017-08-03 ENCOUNTER — Encounter: Payer: Self-pay | Admitting: Family Medicine

## 2017-08-03 ENCOUNTER — Ambulatory Visit (INDEPENDENT_AMBULATORY_CARE_PROVIDER_SITE_OTHER): Payer: Medicare Other | Admitting: Family Medicine

## 2017-08-03 VITALS — BP 156/80 | HR 90 | Temp 98.6°F | Resp 14 | Wt 125.0 lb

## 2017-08-03 DIAGNOSIS — J019 Acute sinusitis, unspecified: Secondary | ICD-10-CM | POA: Diagnosis not present

## 2017-08-03 DIAGNOSIS — E7800 Pure hypercholesterolemia, unspecified: Secondary | ICD-10-CM

## 2017-08-03 DIAGNOSIS — J301 Allergic rhinitis due to pollen: Secondary | ICD-10-CM | POA: Diagnosis not present

## 2017-08-03 DIAGNOSIS — I10 Essential (primary) hypertension: Secondary | ICD-10-CM

## 2017-08-03 MED ORDER — MOMETASONE FUROATE 50 MCG/ACT NA SUSP: 2.0000 | Freq: Every day | NASAL | 12 refills | Status: DC

## 2017-08-03 MED ORDER — METOPROLOL SUCCINATE ER 50 MG PO TB24: 50.0000 mg | ORAL_TABLET | Freq: Every day | ORAL | 3 refills | Status: DC

## 2017-08-03 MED ORDER — AZITHROMYCIN 250 MG PO TABS: ORAL_TABLET | ORAL | 0 refills | Status: DC

## 2017-08-03 NOTE — Progress Notes (Signed)
[name]    DOB: 26-Sep-1945   72 y.o.   MRN: [id number] Visit Date: 08/03/2017  Today's Provider: Wilhemena Durie, MD   Chief Complaint  Patient presents with  . Hypertension   Subjective:    HPI  Hypertension, follow-up:  BP Readings from Last 3 Encounters:  08/03/17 (!) 156/80  01/20/17 (!) 164/82  01/20/17 (!) 168/94    She was last seen for hypertension 6 months ago.  BP at that visit was 164/82. Management since that visit includes none. She reports good compliance with treatment. She is not having side effects.  She is not exercising formally, but is very active. She is adherent to low salt diet.   Outside blood pressures are running high at 130-150/70-90. Patient denies chest pain, chest pressure/discomfort, claudication, dyspnea, exertional chest pressure/discomfort, fatigue, irregular heart beat, lower extremity edema, near-syncope, orthopnea, palpitations, paroxysmal nocturnal dyspnea, syncope and tachypnea.    Wt Readings from Last 3 Encounters:  08/03/17 125 lb (56.7 kg)  01/20/17 136 lb 9.6 oz (62 kg)  01/20/17 136 lb 9.6 oz (62 kg)   ------------------------------------------------------------------------ Pt reports that she has been under a lot of stress lately with her husband being chronically ill. She can tell when her BP goes up when she gets anxious. Pt has been taking 2 of the metoprolol tablets daily the last couple of days. She also has had a cold that she has been taking delsym for. She has cough, post nasal drainage, and sore throat (at first). She reports that she just feels very tired and is not sleeping well at night at all. She is only sleeping about 4 hours at a time.     Allergies  Allergen Reactions  . Sulfa Antibiotics Anaphylaxis and Rash  . Statins Other (See Comments)    Severe abdominal pain.     Current Outpatient Medications:  .  metoprolol succinate (TOPROL-XL) 25 MG 24 hr tablet, TAKE 1.5  TABLETS (37.5 MG TOTAL) BY MOUTH DAILY., Disp: 135 tablet, Rfl: 2 .  Multiple Vitamins-Minerals (MULTIVITAMIN WITH MINERALS) tablet, Take 1 tablet by mouth daily. , Disp: , Rfl:  .  acetaminophen (TYLENOL) 325 MG tablet, Take 325 mg by mouth every 4 (four) hours as needed., Disp: , Rfl:  .  acetaminophen (TYLENOL) 500 MG tablet, Take 500 mg by mouth every 4 (four) hours as needed., Disp: , Rfl:  .  Coenzyme Q10 (CO Q10) 200 MG CAPS, Take 200 mg by mouth daily. , Disp: , Rfl:  .  cyclobenzaprine (FLEXERIL) 10 MG tablet, TAKE 1 TABLET BY MOUTH EVERYDAY AT BEDTIME, Disp: 30 tablet, Rfl: 2 .  Omega 3 1000 MG CAPS, Take 1,000 mg by mouth daily., Disp: , Rfl:  .  predniSONE (STERAPRED UNI-PAK 21 TAB) 10 MG (21) TBPK tablet, Take 6 on the first day then decrease by 1 daily until finished. (Patient not taking: Reported on 08/03/2017), Disp: 21 tablet, Rfl: 0  Review of Systems  Constitutional: Positive for fatigue.  HENT: Positive for congestion, postnasal drip and sore throat.   Eyes: Negative.   Respiratory: Positive for cough.   Cardiovascular: Negative.   Gastrointestinal: Negative.   Endocrine: Negative.   Genitourinary: Negative.   Musculoskeletal: Negative.   Skin: Negative.   Allergic/Immunologic: Negative.   Neurological: Negative.   Hematological: Negative.   Psychiatric/Behavioral: The patient is nervous/anxious.     Social History   Tobacco Use  . Smoking status: Never Smoker  .  Smokeless tobacco: Never Used  Substance Use Topics  . Alcohol use: Yes    Comment: ocassionally wine   Objective:   BP (!) 156/80 (BP Location: Left Arm, Patient Position: Sitting, Cuff Size: Normal)   Pulse 90   Temp 98.6 F (37 C) (Oral)   Resp 14   Wt 125 lb (56.7 kg)   SpO2 96%   BMI 21.46 kg/m  Vitals:   08/03/17 1055  BP: (!) 156/80  Pulse: 90  Resp: 14  Temp: 98.6 F (37 C)  TempSrc: Oral  SpO2: 96%  Weight: 125 lb (56.7 kg)     Physical Exam  Constitutional: She is  oriented to person, place, and time. She appears well-developed and well-nourished.  HENT:  Head: Normocephalic and atraumatic.  Right Ear: External ear normal.  Left Ear: External ear normal.  Nose: Nose normal.  Mouth/Throat: Oropharynx is clear and moist.  Eyes: Conjunctivae are normal. No scleral icterus.  Neck: No thyromegaly present.  Cardiovascular: Normal rate, regular rhythm and normal heart sounds.  Pulmonary/Chest: Effort normal and breath sounds normal.  Abdominal: Soft.  Lymphadenopathy:    She has no cervical adenopathy.  Neurological: She is alert and oriented to person, place, and time.  Skin: Skin is warm and dry.  Psychiatric: She has a normal mood and affect. Her behavior is normal. Judgment and thought content normal.        Assessment & Plan:     1. Essential (primary) hypertension Increase toprol from 37.5 to 50mg  daily. RTC 1-2 months. - CBC with Differential/Platelet - TSH - metoprolol succinate (TOPROL-XL) 50 MG 24 hr tablet; Take 1 tablet (50 mg total) by mouth daily.  Dispense: 90 tablet; Refill: 3  2. Hypercholesteremia  - Lipid panel - Comprehensive metabolic panel  3. Acute sinusitis, recurrence not specified, unspecified location  - azithromycin (ZITHROMAX) 250 MG tablet; Take 2 tablets on the first day then 1 daily until finished.  Dispense: 6 tablet; Refill: 0  4. Allergic rhinitis due to pollen, unspecified seasonality  - mometasone (NASONEX) 50 MCG/ACT nasal spray; Place 2 sprays into the nose daily.  Dispense: 17 g; Refill: 12      I have done the exam and reviewed the above chart and it is accurate to the best of my knowledge. Development worker, community has been used in this note in any air is in the dictation or transcription are unintentional.  Wilhemena Durie, MD  [place]

## 2017-08-06 ENCOUNTER — Emergency Department: Payer: Medicare Other

## 2017-08-06 ENCOUNTER — Encounter: Payer: Self-pay | Admitting: Intensive Care

## 2017-08-06 ENCOUNTER — Emergency Department
Admission: EM | Admit: 2017-08-06 | Discharge: 2017-08-06 | Disposition: A | Discharge status: Home / Self Care | Payer: Medicare Other | Attending: Emergency Medicine | Admitting: Emergency Medicine

## 2017-08-06 DIAGNOSIS — R1032 Left lower quadrant pain: Secondary | ICD-10-CM | POA: Diagnosis not present

## 2017-08-06 DIAGNOSIS — I10 Essential (primary) hypertension: Secondary | ICD-10-CM | POA: Diagnosis not present

## 2017-08-06 DIAGNOSIS — N200 Calculus of kidney: Secondary | ICD-10-CM | POA: Diagnosis not present

## 2017-08-06 DIAGNOSIS — E7800 Pure hypercholesterolemia, unspecified: Secondary | ICD-10-CM | POA: Diagnosis not present

## 2017-08-06 DIAGNOSIS — Z1159 Encounter for screening for other viral diseases: Secondary | ICD-10-CM | POA: Diagnosis not present

## 2017-08-06 DIAGNOSIS — N281 Cyst of kidney, acquired: Secondary | ICD-10-CM | POA: Insufficient documentation

## 2017-08-06 DIAGNOSIS — Z79899 Other long term (current) drug therapy: Secondary | ICD-10-CM | POA: Insufficient documentation

## 2017-08-06 DIAGNOSIS — I454 Nonspecific intraventricular block: Secondary | ICD-10-CM | POA: Diagnosis not present

## 2017-08-06 DIAGNOSIS — R079 Chest pain, unspecified: Secondary | ICD-10-CM | POA: Diagnosis not present

## 2017-08-06 DIAGNOSIS — R112 Nausea with vomiting, unspecified: Secondary | ICD-10-CM | POA: Diagnosis not present

## 2017-08-06 LAB — BASIC METABOLIC PANEL
ANION GAP: 11 (ref 5–15)
BUN: 19 mg/dL (ref 6–20)
CALCIUM: 8.6 mg/dL — AB (ref 8.9–10.3)
CO2: 24 mmol/L (ref 22–32)
CREATININE: 0.88 mg/dL (ref 0.44–1.00)
Chloride: 107 mmol/L (ref 101–111)
GLUCOSE: 129 mg/dL — AB (ref 65–99)
Potassium: 3.7 mmol/L (ref 3.5–5.1)
Sodium: 142 mmol/L (ref 135–145)

## 2017-08-06 LAB — CBC
HCT: 40.5 % (ref 35.0–47.0)
HEMOGLOBIN: 13.6 g/dL (ref 12.0–16.0)
MCH: 31.3 pg (ref 26.0–34.0)
MCHC: 33.7 g/dL (ref 32.0–36.0)
MCV: 92.8 fL (ref 80.0–100.0)
Platelets: 209 10*3/uL (ref 150–440)
RBC: 4.36 MIL/uL (ref 3.80–5.20)
RDW: 13 % (ref 11.5–14.5)
WBC: 8.9 10*3/uL (ref 3.6–11.0)

## 2017-08-06 LAB — URINALYSIS, COMPLETE (UACMP) WITH MICROSCOPIC
Bacteria, UA: NONE SEEN
Bilirubin Urine: NEGATIVE
GLUCOSE, UA: NEGATIVE mg/dL
Ketones, ur: 5 mg/dL — AB
Leukocytes, UA: NEGATIVE
Nitrite: NEGATIVE
PH: 7 (ref 5.0–8.0)
Protein, ur: NEGATIVE mg/dL
SQUAMOUS EPITHELIAL / LPF: NONE SEEN (ref 0–5)
Specific Gravity, Urine: 1.006 (ref 1.005–1.030)

## 2017-08-06 LAB — TROPONIN I

## 2017-08-06 MED ORDER — OXYCODONE-ACETAMINOPHEN 5-325 MG PO TABS: 1.0000 | ORAL_TABLET | ORAL | 0 refills | Status: DC | PRN

## 2017-08-06 MED ORDER — SODIUM CHLORIDE 0.9 % IV BOLUS
1000.0000 mL | Freq: Once | INTRAVENOUS | Status: AC
  Administered 2017-08-06: 1000 mL via INTRAVENOUS

## 2017-08-06 MED ORDER — KETOROLAC TROMETHAMINE 30 MG/ML IJ SOLN
30.0000 mg | Freq: Once | INTRAMUSCULAR | Status: AC
  Administered 2017-08-06: 30 mg via INTRAVENOUS
  Filled 2017-08-06: qty 1

## 2017-08-06 NOTE — ED Provider Notes (Signed)
[facility] Emergency Department Provider Note  ___________________________________________   First MD Initiated Contact with Patient 08/06/17 1904     (approximate)  I have reviewed the triage vital signs and the nursing notes.   HISTORY  Chief Complaint Abdominal Pain   HPI Anna Mcmillan is a 72 y.o. female with a history of hypertension as well as pulmonary fibrosis and kidney stones who is presenting to the emergency department with intermittent left flank pain as well as hematuria.  Says that 3 weeks ago she has an episode of hematuria which resolved spontaneously.  She says that today she had severe left flank pain associated with vomiting.  Says the pain is only 2-3 out of 10 at this time.  However, it is still radiating from her left flank and upper quadrant down to the left lower quadrant.  She said that previously was reading from the left upper flank into the back.  She had several episodes of vomiting earlier.  Does not report any fever.  Not reporting any chest pain at this time.  Reports burning with urination this morning but without any further episodes of blood.  Past Medical History:  Diagnosis Date  . Arthritis   . Cancer [facility]) 2010   left breast   . Graves disease 1996  . Hypertension 1984  . Pulmonary fibrosis ([place]) 2012  . Thyroid disease     Patient Active Problem List   Diagnosis Date Noted  . Breast pain, left 06/14/2015  . 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 Head revolving around 11/22/2014  . Infection of urinary tract 11/22/2014  . Temporary cerebral vascular dysfunction 11/22/2014  . Drug intolerance 11/22/2014  . Body tinea 11/22/2014  . Symptomatic states associated with artificial menopause 11/22/2014  . 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 Fatigue 11/22/2014  . OP (osteoporosis) 11/22/2014  . Arthritis of hand, degenerative 11/22/2014  . Cramp in muscle 11/22/2014  . Amnesia 11/22/2014  .  Enlarged lymph nodes 11/22/2014  . Lump in neck 11/22/2014  . Lipoma of lower extremity 11/22/2014  . BP (high blood pressure) 11/22/2014  . Hypercholesteremia 11/22/2014  . H/O renal calculi 11/22/2014  . [place]ow disease 11/22/2014  . Essential (primary) hypertension 11/22/2014  . History of chicken pox 11/22/2014  . Breast CA ([place]) 11/22/2014  . Atrophic vaginitis 11/22/2014  . Absolute anemia 11/22/2014  . Encounter for screening colonoscopy 09/19/2012  . Cancer ([place])   . Breast mass in female 06/20/2012    Past Surgical History:  Procedure Laterality Date  . ABDOMINAL HYSTERECTOMY  2001  . BREAST BIOPSY Left 2002  . BREAST SURGERY Left 2002   lumpectomy,   . COLONOSCOPY  2002  . COSMETIC SURGERY Left    implant 1977    Prior to Admission medications   Medication Sig Start Date End Date Taking? Authorizing Provider  acetaminophen (TYLENOL) 325 MG tablet Take 325 mg by mouth every 4 (four) hours as needed.    [provider]  acetaminophen (TYLENOL) 500 MG tablet Take 500 mg by mouth every 4 (four) hours as needed.    [provider]  azithromycin (ZITHROMAX) 250 MG tablet Take 2 tablets on the first day then 1 daily until finished. 08/03/17   Jerrol Banana., MD  Coenzyme Q10 (CO Q10) 200 MG CAPS Take 200 mg by mouth daily.  02/04/11   [provider]  cyclobenzaprine (FLEXERIL) 10 MG tablet TAKE 1 TABLET BY MOUTH EVERYDAY AT BEDTIME  02/18/17   Jerrol Banana., MD  metoprolol succinate (TOPROL-XL) 50 MG 24 hr tablet Take 1 tablet (50 mg total) by mouth daily. 08/03/17   Jerrol Banana., MD  mometasone (NASONEX) 50 MCG/ACT nasal spray Place 2 sprays into the nose daily. 08/03/17   Jerrol Banana., MD  Multiple Vitamins-Minerals (MULTIVITAMIN WITH MINERALS) tablet Take 1 tablet by mouth daily.     [provider]  Omega 3 1000 MG CAPS Take 1,000 mg by mouth daily.    [provider]  predniSONE (STERAPRED  UNI-PAK 21 TAB) 10 MG (21) TBPK tablet Take 6 on the first day then decrease by 1 daily until finished. Patient not taking: Reported on 08/03/2017 01/20/17   Jerrol Banana., MD    Allergies Sulfa antibiotics and Statins  Family History  Problem Relation Age of Onset  . Kidney disease Mother        Kidney failure/ received a kidney transplant when she was 47  . Breast cancer Daughter 36       genetic negative    Social History Social History   Tobacco Use  . Smoking status: Never Smoker  . Smokeless tobacco: Never Used  Substance Use Topics  . Alcohol use: Yes    Comment: ocassionally wine  . Drug use: No    Review of Systems  Constitutional: No fever/chills Eyes: No visual changes. ENT: No sore throat. Cardiovascular: Denies chest pain. Respiratory: Denies shortness of breath. Gastrointestinal:  No diarrhea.  No constipation. Genitourinary: Negative for dysuria. Musculoskeletal: As above Skin: Negative for rash. Neurological: Negative for headaches, focal weakness or numbness.   ____________________________________________   PHYSICAL EXAM:  VITAL SIGNS: ED Triage Vitals  Enc Vitals Group     BP 08/06/17 1724 (!) 178/93     Pulse Rate 08/06/17 1724 77     Resp 08/06/17 1724 14     Temp 08/06/17 1724 (!) 97.5 F (36.4 C)     Temp Source 08/06/17 1724 Oral     SpO2 08/06/17 1724 99 %     Weight 08/06/17 1725 125 lb (56.7 kg)     Height 08/06/17 1725 5\' 4"  (1.626 m)     Head Circumference --      Peak Flow --      Pain Score 08/06/17 1724 0     Pain Loc --      Pain Edu? --      Excl. in [place]? --     Constitutional: Alert and oriented. Well appearing and in no acute distress. Eyes: Conjunctivae are normal.  Head: Atraumatic. Nose: No congestion/rhinnorhea. Mouth/Throat: Mucous membranes are moist.  Neck: No stridor.   Cardiovascular: Normal rate, regular rhythm. Grossly normal heart sounds.   Respiratory: Normal respiratory effort.  No  retractions. Lungs CTAB. Gastrointestinal: Soft with minimal left lower quadrant abdominal tenderness to palpation without any rebound or guarding. No distention. No CVA tenderness. Musculoskeletal: No lower extremity tenderness nor edema.  No joint effusions. Neurologic:  Normal speech and language. No gross focal neurologic deficits are appreciated. Skin:  Skin is warm, dry and intact. No rash noted. Psychiatric: Mood and affect are normal. Speech and behavior are normal.  ____________________________________________   LABS (all labs ordered are listed, but only abnormal results are displayed)  Labs Reviewed  BASIC METABOLIC PANEL - Abnormal; Notable for the following components:      Result Value   Glucose, Bld 129 (*)    Calcium 8.6 (*)  All other components within normal limits  CBC  TROPONIN I  URINALYSIS, COMPLETE (UACMP) WITH MICROSCOPIC   ____________________________________________  EKG  ED ECG REPORT I, Doran Stabler, the attending physician, personally viewed and interpreted this ECG.   Date: 08/06/2017  EKG Time: 1725  Rate: 82  Rhythm: normal sinus rhythm  Axis: Normal  Intervals: Nonspecific intraventricular block  ST&T Change: No ST segment elevation or depression.  No abnormal T wave inversion.  ____________________________________________  RADIOLOGY  Chest x-ray without active disease.  5 mm stone over the left UVJ causing low-grade obstruction.  Bilateral renal cyst.  1.3 cm right liver cyst that is unchanged. ____________________________________________   PROCEDURES  Procedure(s) performed:   Procedures  Critical Care performed:   ____________________________________________   INITIAL IMPRESSION / ASSESSMENT AND PLAN / ED COURSE  Pertinent labs & imaging results that were available during my care of the patient were reviewed by me and considered in my medical decision making (see chart for details).  Differential diagnosis  includes, but is not limited to, ovarian cyst, ovarian torsion, acute appendicitis, diverticulitis, urinary tract infection/pyelonephritis, endometriosis, bowel obstruction, colitis, renal colic, gastroenteritis, hernia, fibroids, endometriosis, pregnancy related pain including ectopic pregnancy, etc. As part of my medical decision making, I reviewed the following data within the electronic MEDICAL RECORD NUMBER Notes from prior ED visits  ----------------------------------------- 8:40 PM on 08/06/2017 -----------------------------------------  Patient appears comfortable right now just describes a "soreness."  She will be given Flomax as well as Percocet.  She knows to return for any worsening or concerning symptoms especially worsening abdominal pain, nausea vomiting or fever.  She is also aware of the renal cysts.  She will be following up with urology. ____________________________________________   FINAL CLINICAL IMPRESSION(S) / ED DIAGNOSES  Kidney stone.  Renal cysts.    NEW MEDICATIONS STARTED DURING THIS VISIT:  New Prescriptions   No medications on file     Note:  This document was prepared using Dragon voice recognition software and may include unintentional dictation errors.     Orbie Pyo, MD 08/06/17 2040

## 2017-08-06 NOTE — ED Notes (Signed)
Reviewed discharge instructions, follow-up care, and prescriptions with patient. Patient verbalized understanding of all information reviewed. Patient stable, with no distress noted at this time.    

## 2017-08-06 NOTE — ED Notes (Signed)
First RN note:  Pt comes in via EMS from home c/o lower back and LLQ abdominal pain.  H/o kidney stones.  Patient had blood in her urine the  Other day at her MD's office.  Patient received 100 fentanyl, 4 zofran, 5oo cc Nacl.

## 2017-08-06 NOTE — ED Notes (Signed)
ED Provider at bedside. 

## 2017-08-06 NOTE — ED Triage Notes (Addendum)
Arrived by EMS. Patient reports "I started feeling sharp pains in L rib cage that shot around to my spine. When I urinated I also felt burning. Later I started feeling pains from my groin area to my abdomen" Patient had blood in urine about a week ago. Started Z pack on Monday for chest congestion and metoprolol has been increased due to b/p going up recently. Patient reports feeling stressed due to husband being in [place] and going back and forth visiting him. EMS administered 178mcg fentanyl, 4zofran, 500 cc

## 2017-08-07 LAB — COMPREHENSIVE METABOLIC PANEL
A/G RATIO: 1.6 (ref 1.2–2.2)
ALT: 23 IU/L (ref 0–32)
AST: 26 IU/L (ref 0–40)
Albumin: 4.1 g/dL (ref 3.5–4.8)
Alkaline Phosphatase: 95 IU/L (ref 39–117)
BUN/Creatinine Ratio: 12 (ref 12–28)
BUN: 10 mg/dL (ref 8–27)
Bilirubin Total: 0.3 mg/dL (ref 0.0–1.2)
CALCIUM: 9.3 mg/dL (ref 8.7–10.3)
CO2: 26 mmol/L (ref 20–29)
CREATININE: 0.82 mg/dL (ref 0.57–1.00)
Chloride: 103 mmol/L (ref 96–106)
GFR, EST AFRICAN AMERICAN: 82 mL/min/{1.73_m2} (ref >59)
GFR, EST NON AFRICAN AMERICAN: 71 mL/min/{1.73_m2} (ref >59)
GLOBULIN, TOTAL: 2.6 g/dL (ref 1.5–4.5)
Glucose: 93 mg/dL (ref 65–99)
Potassium: 4.1 mmol/L (ref 3.5–5.2)
SODIUM: 143 mmol/L (ref 134–144)
TOTAL PROTEIN: 6.7 g/dL (ref 6.0–8.5)

## 2017-08-07 LAB — CBC WITH DIFFERENTIAL/PLATELET
BASOS ABS: 0 10*3/uL (ref 0.0–0.2)
Basos: 0 %
EOS (ABSOLUTE): 0.1 10*3/uL (ref 0.0–0.4)
EOS: 1 %
HEMATOCRIT: 43.4 % (ref 34.0–46.6)
Hemoglobin: 14.7 g/dL (ref 11.1–15.9)
IMMATURE GRANULOCYTES: 0 %
Immature Grans (Abs): 0 10*3/uL (ref 0.0–0.1)
LYMPHS ABS: 1.3 10*3/uL (ref 0.7–3.1)
Lymphs: 25 %
MCH: 30.7 pg (ref 26.6–33.0)
MCHC: 33.9 g/dL (ref 31.5–35.7)
MCV: 91 fL (ref 79–97)
MONOS ABS: 0.5 10*3/uL (ref 0.1–0.9)
Monocytes: 10 %
NEUTROS PCT: 64 %
Neutrophils Absolute: 3.4 10*3/uL (ref 1.4–7.0)
PLATELETS: 255 10*3/uL (ref 150–450)
RBC: 4.79 x10E6/uL (ref 3.77–5.28)
RDW: 13.6 % (ref 12.3–15.4)
WBC: 5.2 10*3/uL (ref 3.4–10.8)

## 2017-08-07 LAB — LIPID PANEL
CHOL/HDL RATIO: 3.8 ratio (ref 0.0–4.4)
CHOLESTEROL TOTAL: 249 mg/dL — AB (ref 100–199)
HDL: 66 mg/dL (ref >39)
LDL Calculated: 165 mg/dL — ABNORMAL HIGH (ref 0–99)
TRIGLYCERIDES: 91 mg/dL (ref 0–149)
VLDL Cholesterol Cal: 18 mg/dL (ref 5–40)

## 2017-08-07 LAB — TSH: TSH: 1.41 u[IU]/mL (ref 0.450–4.500)

## 2017-08-07 LAB — HEPATITIS C ANTIBODY: Hep C Virus Ab: <0.1 s/co ratio (ref 0.0–0.9)

## 2017-08-09 NOTE — H&P (View-Only) (Signed)
08/12/2017 1:45 PM   Anna Mcmillan Jun 08, 1945 [id number]  Referring provider: Jerrol Banana., MD [address] [place], [place] [address]  Chief Complaint  Patient presents with  . Nephrolithiasis    HPI: Patient is a 72 year old Caucasian female referred from [facility] ED for a left ureteral stone.  She presented to the ED on August 06, 2017 with a complaint of left flank pain, gross hematuria and vomiting.   Her UA was positive for 21-50 RBCs.  Her WBC count was 8.9.  Her serum creatinine was 0.88.  CT renal stone study noted 5 mm stone over the left UVJ causing low-grade obstruction.  Bilateral renal cysts.  1.3 cm right liver cyst unchanged.  She was given an injection of Toradol in the ED.  She was discharged with Percocet.  Today, she did have frequency, nocturia, intermittency, hesitancy and blood in the urine.  Patient denies any  dysuria or suprapubic/flank pain.  Patient denies any fevers, chills, nausea or vomiting.  She has not passed a fragment and she has been straining.    Her UA is positive for 3-10 RBC's.    She had been having intermittent left flank with gross hematuria for over two months.    She does have a prior history of stones treated with ESWL about ten years ago.     PMH: Past Medical History:  Diagnosis Date  . Arthritis   . Cancer [facility]) 2010   left breast   . Graves disease 1996  . Hypertension 1984  . Pulmonary fibrosis ([place]) 2012  . Thyroid disease     Surgical History: Past Surgical History:  Procedure Laterality Date  . ABDOMINAL HYSTERECTOMY  2001  . BREAST BIOPSY Left 2002  . BREAST SURGERY Left 2002   lumpectomy,   . COLONOSCOPY  2002  . COSMETIC SURGERY Left    implant 1977    Home Medications:  Allergies as of 08/12/2017      Reactions   Sulfa Antibiotics Anaphylaxis, Rash   Sulfasalazine Anaphylaxis, Rash   Statins Other (See Comments)   Severe abdominal pain.      Medication List        Accurate  as of 08/12/17  1:45 PM. Always use your most recent med list.          metoprolol succinate 50 MG 24 hr tablet Commonly known as:  TOPROL-XL Take 1 tablet (50 mg total) by mouth daily.       Allergies:  Allergies  Allergen Reactions  . Sulfa Antibiotics Anaphylaxis and Rash  . Sulfasalazine Anaphylaxis and Rash  . Statins Other (See Comments)    Severe abdominal pain.    Family History: Family History  Problem Relation Age of Onset  . Kidney disease Mother        Kidney failure/ received a kidney transplant when she was 72  . Breast cancer Daughter 86       genetic negative    Social History:  reports that she has never smoked. She has never used smokeless tobacco. She reports that she drinks alcohol. She reports that she does not use drugs.  ROS: UROLOGY Frequent Urination?: Yes Hard to postpone urination?: No Burning/pain with urination?: No Get up at night to urinate?: Yes Leakage of urine?: No Urine stream starts and stops?: Yes Trouble starting stream?: Yes Do you have to strain to urinate?: No Blood in urine?: Yes Urinary tract infection?: No Sexually transmitted disease?: No Injury to kidneys  or bladder?: No Painful intercourse?: No Weak stream?: No Currently pregnant?: No Vaginal bleeding?: No  Gastrointestinal Nausea?: No Vomiting?: No Indigestion/heartburn?: No Diarrhea?: No Constipation?: No  Constitutional Fever: No Night sweats?: No Weight loss?: Yes Fatigue?: Yes  Skin Skin rash/lesions?: No Itching?: No  Eyes Blurred vision?: No Double vision?: No  Ears/Nose/Throat Sore throat?: No Sinus problems?: No  Hematologic/Lymphatic Swollen glands?: No Easy bruising?: No  Cardiovascular Leg swelling?: No Chest pain?: No  Respiratory Cough?: No Shortness of breath?: No  Endocrine Excessive thirst?: No  Musculoskeletal Back pain?: No Joint pain?: No  Neurological Headaches?: No Dizziness?:  No  Psychologic Depression?: No Anxiety?: No  Physical Exam: BP 136/85   Pulse 88   Resp 16   Ht 5\' 4"  (1.626 m)   Wt 123 lb (55.8 kg)   SpO2 96%   BMI 21.11 kg/m   Constitutional:  Well nourished. Alert and oriented, No acute distress. HEENT:  AT, moist mucus membranes.  Trachea midline, no masses. Cardiovascular: No clubbing, cyanosis, or edema. Respiratory: Normal respiratory effort, no increased work of breathing. GI: Abdomen is soft, non tender, non distended, no abdominal masses. Liver and spleen not palpable.  No hernias appreciated.  Stool sample for occult testing is not indicated.   GU: No CVA tenderness.  No bladder fullness or masses.   Skin: No rashes, bruises or suspicious lesions. Lymph: No cervical or inguinal adenopathy. Neurologic: Grossly intact, no focal deficits, moving all 4 extremities. Psychiatric: Normal mood and affect.  Laboratory Data: Lab Results  Component Value Date   WBC 8.9 08/06/2017   HGB 13.6 08/06/2017   HCT 40.5 08/06/2017   MCV 92.8 08/06/2017   PLT 209 08/06/2017    Lab Results  Component Value Date   CREATININE 0.88 08/06/2017    No results found for: PSA  No results found for: TESTOSTERONE  No results found for: HGBA1C  Lab Results  Component Value Date   TSH 1.410 08/06/2017       Component Value Date/Time   CHOL 249 (H) 08/06/2017 0808   HDL 66 08/06/2017 0808   CHOLHDL 3.8 08/06/2017 0808   LDLCALC 165 (H) 08/06/2017 0808    Lab Results  Component Value Date   AST 26 08/06/2017   Lab Results  Component Value Date   ALT 23 08/06/2017   No components found for: ALKALINEPHOPHATASE No components found for: BILIRUBINTOTAL  No results found for: ESTRADIOL  Urinalysis 3-10 RBC's.  See Epic.  I have reviewed the labs.   Pertinent Imaging: CLINICAL DATA:  Low back and left lower quadrant pain. History of kidney stones. Hematuria.  EXAM: CT ABDOMEN AND PELVIS WITHOUT  CONTRAST  TECHNIQUE: Multidetector CT imaging of the abdomen and pelvis was performed following the standard protocol without IV contrast.  COMPARISON:  01/22/2010  FINDINGS: Lower chest: Minimal bibasilar scarring.  Hepatobiliary: 1.3 cm cyst over the dome of the right lobe of the liver unchanged. Gallbladder and biliary tree within normal.  Pancreas: Normal.  Spleen: Normal.  Adrenals/Urinary Tract: Adrenal glands are normal. Kidneys are normal in size with mild prominence of the left intrarenal collecting system. Subcentimeter hypodensity over the lower pole cortex of the left kidney. 2 cm cyst over the upper pole right kidney. Right ureter is normal. Mild dilatation of the left ureter as there is a 5 mm stone at the left UVJ causing this low-grade obstruction. Bladder is normal.  Stomach/Bowel: Stomach and small bowel are normal. Appendix is normal. Colon is normal.  Vascular/Lymphatic: Mild calcified plaque over the abdominal aorta. No adenopathy.  Reproductive: Previous hysterectomy.  Other: No free fluid or focal inflammatory change.  Musculoskeletal: Degenerative change of the spine and hips.  IMPRESSION: 5 mm stone over the left UVJ causing low-grade obstruction.  Bilateral renal cysts.  1.3 cm right liver cyst unchanged.   Electronically Signed   By: Marin Olp M.D.   On: 08/06/2017 20:16  I have independently reviewed the films.    Assessment & Plan:    1. Left ureteral stone Patient did not want to pursue ESWL due to her being highly allergic to sulfas (cannot take tamsulosin) and the fact she has been dealing with this for two months. We discussed ESWL and URS.  She would like to have ESWL as she is taking care of her husband who is on dialysis and she has had the procedure in the past and it was successful   2. Left hydronephrosis  - obtain RUS to ensure the hydronephrosis has resolved once they have passed and/or recovered  from procedure to ensure to iatrogenic hydronephrosis remains - it is explained to the patient that it is important to document resolution of the hydronephrosis as "silent hydronephrosis" can occur and cause damage and/or loss of the kidney  3. Gross hematuria  - UA today demonstrates 3-10 RBC's  - continue to monitor the patient's UA after the treatment/passage of the stone to ensure the hematuria has resolved  - if hematuria persists, we will pursue a hematuria workup with CT Urogram and cystoscopy if appropriate.  4. Bilateral renal cysts Will evaluate again when patient undergoes renal ultrasound after she recovers from ESWL    Return for left ESWL .  These notes generated with voice recognition software. I apologize for typographical errors.  Zara Council, PA-C  [facility] [address]  [place], [place] [address] [phone]

## 2017-08-09 NOTE — Progress Notes (Signed)
08/12/2017 1:45 PM   Anna Mcmillan Apr 06, 1945 [id number]  Referring provider: Jerrol Banana., MD [address] [place], [place] [address]  Chief Complaint  Patient presents with  . Nephrolithiasis    HPI: Patient is a 72 year old Caucasian female referred from [facility] ED for a left ureteral stone.  She presented to the ED on August 06, 2017 with a complaint of left flank pain, gross hematuria and vomiting.   Her UA was positive for 21-50 RBCs.  Her WBC count was 8.9.  Her serum creatinine was 0.88.  CT renal stone study noted 5 mm stone over the left UVJ causing low-grade obstruction.  Bilateral renal cysts.  1.3 cm right liver cyst unchanged.  She was given an injection of Toradol in the ED.  She was discharged with Percocet.  Today, she did have frequency, nocturia, intermittency, hesitancy and blood in the urine.  Patient denies any  dysuria or suprapubic/flank pain.  Patient denies any fevers, chills, nausea or vomiting.  She has not passed a fragment and she has been straining.    Her UA is positive for 3-10 RBC's.    She had been having intermittent left flank with gross hematuria for over two months.    She does have a prior history of stones treated with ESWL about ten years ago.     PMH: Past Medical History:  Diagnosis Date  . Arthritis   . Cancer [facility]) 2010   left breast   . Graves disease 1996  . Hypertension 1984  . Pulmonary fibrosis ([place]) 2012  . Thyroid disease     Surgical History: Past Surgical History:  Procedure Laterality Date  . ABDOMINAL HYSTERECTOMY  2001  . BREAST BIOPSY Left 2002  . BREAST SURGERY Left 2002   lumpectomy,   . COLONOSCOPY  2002  . COSMETIC SURGERY Left    implant 1977    Home Medications:  Allergies as of 08/12/2017      Reactions   Sulfa Antibiotics Anaphylaxis, Rash   Sulfasalazine Anaphylaxis, Rash   Statins Other (See Comments)   Severe abdominal pain.      Medication List        Accurate  as of 08/12/17  1:45 PM. Always use your most recent med list.          metoprolol succinate 50 MG 24 hr tablet Commonly known as:  TOPROL-XL Take 1 tablet (50 mg total) by mouth daily.       Allergies:  Allergies  Allergen Reactions  . Sulfa Antibiotics Anaphylaxis and Rash  . Sulfasalazine Anaphylaxis and Rash  . Statins Other (See Comments)    Severe abdominal pain.    Family History: Family History  Problem Relation Age of Onset  . Kidney disease Mother        Kidney failure/ received a kidney transplant when she was 89  . Breast cancer Daughter 93       genetic negative    Social History:  reports that she has never smoked. She has never used smokeless tobacco. She reports that she drinks alcohol. She reports that she does not use drugs.  ROS: UROLOGY Frequent Urination?: Yes Hard to postpone urination?: No Burning/pain with urination?: No Get up at night to urinate?: Yes Leakage of urine?: No Urine stream starts and stops?: Yes Trouble starting stream?: Yes Do you have to strain to urinate?: No Blood in urine?: Yes Urinary tract infection?: No Sexually transmitted disease?: No Injury to kidneys  or bladder?: No Painful intercourse?: No Weak stream?: No Currently pregnant?: No Vaginal bleeding?: No  Gastrointestinal Nausea?: No Vomiting?: No Indigestion/heartburn?: No Diarrhea?: No Constipation?: No  Constitutional Fever: No Night sweats?: No Weight loss?: Yes Fatigue?: Yes  Skin Skin rash/lesions?: No Itching?: No  Eyes Blurred vision?: No Double vision?: No  Ears/Nose/Throat Sore throat?: No Sinus problems?: No  Hematologic/Lymphatic Swollen glands?: No Easy bruising?: No  Cardiovascular Leg swelling?: No Chest pain?: No  Respiratory Cough?: No Shortness of breath?: No  Endocrine Excessive thirst?: No  Musculoskeletal Back pain?: No Joint pain?: No  Neurological Headaches?: No Dizziness?:  No  Psychologic Depression?: No Anxiety?: No  Physical Exam: BP 136/85   Pulse 88   Resp 16   Ht 5\' 4"  (1.626 m)   Wt 123 lb (55.8 kg)   SpO2 96%   BMI 21.11 kg/m   Constitutional:  Well nourished. Alert and oriented, No acute distress. HEENT: [place] AT, moist mucus membranes.  Trachea midline, no masses. Cardiovascular: No clubbing, cyanosis, or edema. Respiratory: Normal respiratory effort, no increased work of breathing. GI: Abdomen is soft, non tender, non distended, no abdominal masses. Liver and spleen not palpable.  No hernias appreciated.  Stool sample for occult testing is not indicated.   GU: No CVA tenderness.  No bladder fullness or masses.   Skin: No rashes, bruises or suspicious lesions. Lymph: No cervical or inguinal adenopathy. Neurologic: Grossly intact, no focal deficits, moving all 4 extremities. Psychiatric: Normal mood and affect.  Laboratory Data: Lab Results  Component Value Date   WBC 8.9 08/06/2017   HGB 13.6 08/06/2017   HCT 40.5 08/06/2017   MCV 92.8 08/06/2017   PLT 209 08/06/2017    Lab Results  Component Value Date   CREATININE 0.88 08/06/2017    No results found for: PSA  No results found for: TESTOSTERONE  No results found for: HGBA1C  Lab Results  Component Value Date   TSH 1.410 08/06/2017       Component Value Date/Time   CHOL 249 (H) 08/06/2017 0808   HDL 66 08/06/2017 0808   CHOLHDL 3.8 08/06/2017 0808   LDLCALC 165 (H) 08/06/2017 0808    Lab Results  Component Value Date   AST 26 08/06/2017   Lab Results  Component Value Date   ALT 23 08/06/2017   No components found for: ALKALINEPHOPHATASE No components found for: BILIRUBINTOTAL  No results found for: ESTRADIOL  Urinalysis 3-10 RBC's.  See Epic.  I have reviewed the labs.   Pertinent Imaging: CLINICAL DATA:  Low back and left lower quadrant pain. History of kidney stones. Hematuria.  EXAM: CT ABDOMEN AND PELVIS WITHOUT  CONTRAST  TECHNIQUE: Multidetector CT imaging of the abdomen and pelvis was performed following the standard protocol without IV contrast.  COMPARISON:  01/22/2010  FINDINGS: Lower chest: Minimal bibasilar scarring.  Hepatobiliary: 1.3 cm cyst over the dome of the right lobe of the liver unchanged. Gallbladder and biliary tree within normal.  Pancreas: Normal.  Spleen: Normal.  Adrenals/Urinary Tract: Adrenal glands are normal. Kidneys are normal in size with mild prominence of the left intrarenal collecting system. Subcentimeter hypodensity over the lower pole cortex of the left kidney. 2 cm cyst over the upper pole right kidney. Right ureter is normal. Mild dilatation of the left ureter as there is a 5 mm stone at the left UVJ causing this low-grade obstruction. Bladder is normal.  Stomach/Bowel: Stomach and small bowel are normal. Appendix is normal. Colon is normal.  Vascular/Lymphatic: Mild calcified plaque over the abdominal aorta. No adenopathy.  Reproductive: Previous hysterectomy.  Other: No free fluid or focal inflammatory change.  Musculoskeletal: Degenerative change of the spine and hips.  IMPRESSION: 5 mm stone over the left UVJ causing low-grade obstruction.  Bilateral renal cysts.  1.3 cm right liver cyst unchanged.   Electronically Signed   By: Marin Olp M.D.   On: 08/06/2017 20:16  I have independently reviewed the films.    Assessment & Plan:    1. Left ureteral stone Patient did not want to pursue ESWL due to her being highly allergic to sulfas (cannot take tamsulosin) and the fact she has been dealing with this for two months. We discussed ESWL and URS.  She would like to have ESWL as she is taking care of her husband who is on dialysis and she has had the procedure in the past and it was successful   2. Left hydronephrosis  - obtain RUS to ensure the hydronephrosis has resolved once they have passed and/or recovered  from procedure to ensure to iatrogenic hydronephrosis remains - it is explained to the patient that it is important to document resolution of the hydronephrosis as "silent hydronephrosis" can occur and cause damage and/or loss of the kidney  3. Gross hematuria  - UA today demonstrates 3-10 RBC's  - continue to monitor the patient's UA after the treatment/passage of the stone to ensure the hematuria has resolved  - if hematuria persists, we will pursue a hematuria workup with CT Urogram and cystoscopy if appropriate.  4. Bilateral renal cysts Will evaluate again when patient undergoes renal ultrasound after she recovers from ESWL    Return for left ESWL .  These notes generated with voice recognition software. I apologize for typographical errors.  Zara Council, PA-C  [facility] [address]  [place], [place] [address] [phone]

## 2017-08-10 ENCOUNTER — Telehealth: Payer: Self-pay

## 2017-08-10 NOTE — Telephone Encounter (Signed)
LMTCB

## 2017-08-10 NOTE — Telephone Encounter (Signed)
-----   Message from Jerrol Banana., MD sent at 08/07/2017 12:38 PM EDT ----- Labs OK

## 2017-08-11 NOTE — Telephone Encounter (Signed)
Patient advised as below.  

## 2017-08-12 ENCOUNTER — Other Ambulatory Visit: Payer: Self-pay | Admitting: Radiology

## 2017-08-12 ENCOUNTER — Ambulatory Visit (INDEPENDENT_AMBULATORY_CARE_PROVIDER_SITE_OTHER): Payer: Medicare Other | Admitting: Urology

## 2017-08-12 ENCOUNTER — Encounter: Payer: Self-pay | Admitting: Urology

## 2017-08-12 VITALS — BP 136/85 | HR 88 | Resp 16 | Ht 64.0 in | Wt 123.0 lb

## 2017-08-12 DIAGNOSIS — N281 Cyst of kidney, acquired: Secondary | ICD-10-CM | POA: Diagnosis not present

## 2017-08-12 DIAGNOSIS — N201 Calculus of ureter: Secondary | ICD-10-CM | POA: Diagnosis not present

## 2017-08-12 DIAGNOSIS — R310 Gross hematuria: Secondary | ICD-10-CM

## 2017-08-12 DIAGNOSIS — N132 Hydronephrosis with renal and ureteral calculous obstruction: Secondary | ICD-10-CM | POA: Diagnosis not present

## 2017-08-12 LAB — MICROSCOPIC EXAMINATION: EPITHELIAL CELLS (NON RENAL): NONE SEEN /HPF (ref 0–10)

## 2017-08-12 LAB — URINALYSIS, COMPLETE
BILIRUBIN UA: NEGATIVE
Glucose, UA: NEGATIVE
KETONES UA: NEGATIVE
NITRITE UA: NEGATIVE
PH UA: 7 (ref 5.0–7.5)
Protein, UA: NEGATIVE
SPEC GRAV UA: 1.01 (ref 1.005–1.030)
UUROB: 0.2 mg/dL (ref 0.2–1.0)

## 2017-08-13 ENCOUNTER — Encounter: Payer: Self-pay | Admitting: *Deleted

## 2017-08-13 ENCOUNTER — Ambulatory Visit
Admission: RE | Admit: 2017-08-13 | Discharge: 2017-08-13 | Disposition: A | Discharge status: Home / Self Care | Payer: Medicare Other | Source: Ambulatory Visit | Attending: Urology | Admitting: Urology

## 2017-08-13 ENCOUNTER — Other Ambulatory Visit: Payer: Self-pay

## 2017-08-13 ENCOUNTER — Encounter
Admission: RE | Disposition: A | Discharge status: Home / Self Care | Payer: Self-pay | Source: Ambulatory Visit | Attending: Urology

## 2017-08-13 ENCOUNTER — Ambulatory Visit: Payer: Medicare Other

## 2017-08-13 DIAGNOSIS — Z79899 Other long term (current) drug therapy: Secondary | ICD-10-CM | POA: Diagnosis not present

## 2017-08-13 DIAGNOSIS — N281 Cyst of kidney, acquired: Secondary | ICD-10-CM | POA: Diagnosis not present

## 2017-08-13 DIAGNOSIS — I10 Essential (primary) hypertension: Secondary | ICD-10-CM | POA: Diagnosis not present

## 2017-08-13 DIAGNOSIS — N132 Hydronephrosis with renal and ureteral calculous obstruction: Secondary | ICD-10-CM | POA: Diagnosis not present

## 2017-08-13 DIAGNOSIS — N201 Calculus of ureter: Secondary | ICD-10-CM | POA: Diagnosis not present

## 2017-08-13 DIAGNOSIS — Z888 Allergy status to other drugs, medicaments and biological substances status: Secondary | ICD-10-CM | POA: Diagnosis not present

## 2017-08-13 DIAGNOSIS — Z853 Personal history of malignant neoplasm of breast: Secondary | ICD-10-CM | POA: Insufficient documentation

## 2017-08-13 DIAGNOSIS — K7689 Other specified diseases of liver: Secondary | ICD-10-CM | POA: Insufficient documentation

## 2017-08-13 DIAGNOSIS — M1990 Unspecified osteoarthritis, unspecified site: Secondary | ICD-10-CM | POA: Diagnosis not present

## 2017-08-13 DIAGNOSIS — Z882 Allergy status to sulfonamides status: Secondary | ICD-10-CM | POA: Diagnosis not present

## 2017-08-13 DIAGNOSIS — J8410 Pulmonary fibrosis, unspecified: Secondary | ICD-10-CM | POA: Diagnosis not present

## 2017-08-13 HISTORY — PX: EXTRACORPOREAL SHOCK WAVE LITHOTRIPSY: SHX1557

## 2017-08-13 SURGERY — LITHOTRIPSY, ESWL
Anesthesia: Moderate Sedation | Laterality: Left

## 2017-08-13 MED ORDER — DIPHENHYDRAMINE HCL 25 MG PO CAPS
ORAL_CAPSULE | ORAL | Status: AC
  Administered 2017-08-13: 25 mg
  Filled 2017-08-13: qty 1

## 2017-08-13 MED ORDER — SODIUM CHLORIDE 0.9 % IV SOLN: INTRAVENOUS | Status: DC

## 2017-08-13 MED ORDER — DOCUSATE SODIUM 100 MG PO CAPS: 100.0000 mg | ORAL_CAPSULE | Freq: Two times a day (BID) | ORAL | 0 refills | Status: DC

## 2017-08-13 MED ORDER — ONDANSETRON HCL 4 MG/2ML IJ SOLN
4.0000 mg | Freq: Once | INTRAMUSCULAR | Status: AC | PRN
  Administered 2017-08-13: 4 mg via INTRAVENOUS

## 2017-08-13 MED ORDER — DIAZEPAM 5 MG PO TABS
ORAL_TABLET | ORAL | Status: AC
  Filled 2017-08-13: qty 2

## 2017-08-13 MED ORDER — TAMSULOSIN HCL 0.4 MG PO CAPS: 0.4000 mg | ORAL_CAPSULE | Freq: Every day | ORAL | 0 refills | Status: DC

## 2017-08-13 MED ORDER — CIPROFLOXACIN HCL 500 MG PO TABS: 500.0000 mg | ORAL_TABLET | ORAL | Status: DC

## 2017-08-13 MED ORDER — CIPROFLOXACIN HCL 500 MG PO TABS
ORAL_TABLET | ORAL | Status: AC
  Administered 2017-08-13: 500 mg
  Filled 2017-08-13: qty 1

## 2017-08-13 MED ORDER — ONDANSETRON HCL 4 MG/2ML IJ SOLN
INTRAMUSCULAR | Status: AC
  Filled 2017-08-13: qty 2

## 2017-08-13 MED ORDER — HYDROCODONE-ACETAMINOPHEN 5-325 MG PO TABS: 1.0000 | ORAL_TABLET | Freq: Four times a day (QID) | ORAL | 0 refills | Status: DC | PRN

## 2017-08-13 MED ORDER — DIPHENHYDRAMINE HCL 25 MG PO CAPS: 25.0000 mg | ORAL_CAPSULE | ORAL | Status: DC

## 2017-08-13 MED ORDER — DIAZEPAM 5 MG PO TABS
10.0000 mg | ORAL_TABLET | ORAL | Status: AC
  Administered 2017-08-13: 10 mg via ORAL

## 2017-08-13 NOTE — OR Nursing (Signed)
IV discontinued from left forearm. Site clear.

## 2017-08-13 NOTE — Interval H&P Note (Signed)
History and Physical Interval Note:  08/13/2017 11:16 AM  Anna Mcmillan  has presented today for surgery, with the diagnosis of Kidney stone  The various methods of treatment have been discussed with the patient and family. After consideration of risks, benefits and other options for treatment, the patient has consented to  Procedure(s): EXTRACORPOREAL SHOCK WAVE LITHOTRIPSY (ESWL) (Left) as a surgical intervention .  The patient's history has been reviewed, patient examined, no change in status, stable for surgery.  I have reviewed the patient's chart and labs.  Questions were answered to the patient's satisfaction.     Hollice Espy

## 2017-08-13 NOTE — OR Nursing (Signed)
Discussed discharge instructions with pt and friend. Both voice understanding.

## 2017-08-13 NOTE — Discharge Instructions (Signed)
See [facility] discharge instructions in chart.  AMBULATORY SURGERY  DISCHARGE INSTRUCTIONS   1) The drugs that you were given will stay in your system until tomorrow so for the next 24 hours you should not:  A) Drive an automobile B) Make any legal decisions C) Drink any alcoholic beverage   2) You may resume regular meals tomorrow.  Today it is better to start with liquids and gradually work up to solid foods.  You may eat anything you prefer, but it is better to start with liquids, then soup and crackers, and gradually work up to solid foods.   3) Please notify your doctor immediately if you have any unusual bleeding, trouble breathing, redness and pain at the surgery site, drainage, fever, or pain not relieved by medication.    4) Additional Instructions:        Please contact your physician with any problems or Same Day Surgery at [phone], Monday through Friday 6 am to 4 pm, or [facility] Main number at [phone].  

## 2017-08-14 ENCOUNTER — Encounter: Payer: Self-pay | Admitting: Urology

## 2017-08-15 LAB — CULTURE, URINE COMPREHENSIVE

## 2017-08-24 ENCOUNTER — Ambulatory Visit
Admission: RE | Admit: 2017-08-24 | Discharge: 2017-08-24 | Disposition: A | Discharge status: Home / Self Care | Payer: Medicare Other | Source: Ambulatory Visit | Attending: Urology | Admitting: Urology

## 2017-08-24 ENCOUNTER — Telehealth: Payer: Self-pay | Admitting: Urology

## 2017-08-24 DIAGNOSIS — N201 Calculus of ureter: Secondary | ICD-10-CM

## 2017-08-24 DIAGNOSIS — Z87442 Personal history of urinary calculi: Secondary | ICD-10-CM | POA: Insufficient documentation

## 2017-08-24 NOTE — Telephone Encounter (Signed)
Pt informed

## 2017-08-24 NOTE — Telephone Encounter (Signed)
Please place order for KUB.  Patient has a follow up appointment on Wednesday.  She went twice on Friday and there was no order.  Please call patient when order has been placed.  She can be reached at [phone].

## 2017-08-25 NOTE — Progress Notes (Signed)
08/26/2017 1:26 PM   Anna Mcmillan May 28, 1945 [id number]  Referring provider: Jerrol Banana., MD [address] [place], [place] [address]  No chief complaint on file.   HPI: Patient is a 72 year old Caucasian female who is s/p left ESWL on 08/14/2014 with Dr. Erlene Quan for a left ureteral stone.    On August 06, 2017 CT renal stone study noted 5 mm stone over the left UVJ causing low-grade obstruction.  Bilateral renal cysts.  1.3 cm right liver cyst unchanged.  She underwent ESWL on 08/13/2017.  Smudging of the stone was achieved during procedure.  Post procedural course was uneventful and as expected.    KUB on 08/24/2017 noted several calcifications within the pelvis. Majority represent phleboliths. Possibility of a 4 mm distal left ureteral calculus is raised.  Today, she is not having any urinary symptoms.  Patient denies any gross hematuria, dysuria or suprapubic/flank pain.  Patient denies any fevers, chills, nausea or vomiting.   PMH: Past Medical History:  Diagnosis Date  . Arthritis   . Cancer [facility]) 2010   left breast   . Graves disease 1996  . Hypertension 1984  . Pulmonary fibrosis ([place]) 2012  . Thyroid disease     Surgical History: Past Surgical History:  Procedure Laterality Date  . ABDOMINAL HYSTERECTOMY  2001  . BREAST BIOPSY Left 2002  . BREAST SURGERY Left 2002   lumpectomy,   . COLONOSCOPY  2002  . COSMETIC SURGERY Left    implant 1977  . EXTRACORPOREAL SHOCK WAVE LITHOTRIPSY Left 08/13/2017   Procedure: EXTRACORPOREAL SHOCK WAVE LITHOTRIPSY (ESWL);  Surgeon: Hollice Espy, MD;  Location: [facility] ORS;  Service: Urology;  Laterality: Left;    Home Medications:  Allergies as of 08/26/2017      Reactions   Sulfa Antibiotics Anaphylaxis, Rash   Sulfasalazine Anaphylaxis, Rash   Statins Other (See Comments)   Severe abdominal pain.   Tamsulosin    Dry cough      Medication List        Accurate as of 08/26/17  1:26 PM. Always  use your most recent med list.          metoprolol succinate 50 MG 24 hr tablet Commonly known as:  TOPROL-XL Take 1 tablet (50 mg total) by mouth daily.       Allergies:  Allergies  Allergen Reactions  . Sulfa Antibiotics Anaphylaxis and Rash  . Sulfasalazine Anaphylaxis and Rash  . Statins Other (See Comments)    Severe abdominal pain.  . Tamsulosin     Dry cough    Family History: Family History  Problem Relation Age of Onset  . Kidney disease Mother        Kidney failure/ received a kidney transplant when she was 18  . Breast cancer Daughter 13       genetic negative    Social History:  reports that she has never smoked. She has never used smokeless tobacco. She reports that she drinks alcohol. She reports that she does not use drugs.  ROS: UROLOGY Frequent Urination?: No Hard to postpone urination?: No Burning/pain with urination?: No Get up at night to urinate?: No Leakage of urine?: No Urine stream starts and stops?: No Trouble starting stream?: No Do you have to strain to urinate?: No Blood in urine?: Yes Urinary tract infection?: No Sexually transmitted disease?: No Injury to kidneys or bladder?: No Painful intercourse?: No Weak stream?: No Currently pregnant?: No Vaginal bleeding?: No Last menstrual  period?: n  Gastrointestinal Nausea?: No Vomiting?: No Indigestion/heartburn?: No Diarrhea?: No Constipation?: No  Constitutional Fever: No Night sweats?: No Weight loss?: No Fatigue?: No  Skin Skin rash/lesions?: No Itching?: No  Eyes Blurred vision?: No Double vision?: No  Ears/Nose/Throat Sore throat?: No Sinus problems?: No  Hematologic/Lymphatic Swollen glands?: No Easy bruising?: No  Cardiovascular Leg swelling?: No Chest pain?: No  Respiratory Cough?: No Shortness of breath?: No  Endocrine Excessive thirst?: No  Musculoskeletal Back pain?: No Joint pain?: No  Neurological Headaches?: No Dizziness?:  No  Psychologic Depression?: No Anxiety?: No  Physical Exam: BP (!) 159/82   Pulse 76   Ht 5\' 4"  (1.626 m)   Wt 123 lb (55.8 kg)   BMI 21.11 kg/m   Constitutional:  Well nourished. Alert and oriented, No acute distress. HEENT: [place] AT, moist mucus membranes.  Trachea midline, no masses. Cardiovascular: No clubbing, cyanosis, or edema. Respiratory: Normal respiratory effort, no increased work of breathing. GI: Abdomen is soft, non tender, non distended, no abdominal masses. Liver and spleen not palpable.  No hernias appreciated.  Stool sample for occult testing is not indicated.   GU: No CVA tenderness.  No bladder fullness or masses.   Skin: No rashes, bruises or suspicious lesions. Lymph: No cervical or inguinal adenopathy. Neurologic: Grossly intact, no focal deficits, moving all 4 extremities. Psychiatric: Normal mood and affect.  Laboratory Data: Lab Results  Component Value Date   WBC 8.9 08/06/2017   HGB 13.6 08/06/2017   HCT 40.5 08/06/2017   MCV 92.8 08/06/2017   PLT 209 08/06/2017    Lab Results  Component Value Date   CREATININE 0.88 08/06/2017    No results found for: PSA  No results found for: TESTOSTERONE  No results found for: HGBA1C  Lab Results  Component Value Date   TSH 1.410 08/06/2017       Component Value Date/Time   CHOL 249 (H) 08/06/2017 0808   HDL 66 08/06/2017 0808   CHOLHDL 3.8 08/06/2017 0808   LDLCALC 165 (H) 08/06/2017 0808    Lab Results  Component Value Date   AST 26 08/06/2017   Lab Results  Component Value Date   ALT 23 08/06/2017   No components found for: ALKALINEPHOPHATASE No components found for: BILIRUBINTOTAL  No results found for: ESTRADIOL   I have reviewed the labs.   Pertinent Imaging: CLINICAL DATA:  72 year old female with left ureteral stone post lithotripsy. Continued left flank pain. Subsequent encounter.  EXAM: ABDOMEN - 1 VIEW  COMPARISON:  08/13/2017 plain film exam.  08/06/2017  CT.  FINDINGS: Several calcifications within the pelvis. Majority represent phleboliths. Possibility of a 4 mm distal left ureteral calculus is raised.  Normal bowel gas pattern.  No acute osseous abnormality.  IMPRESSION: Several calcifications within the pelvis. Majority represent phleboliths. Possibility of a 4 mm distal left ureteral calculus is raised.   Electronically Signed   By: Genia Del M.D.   On: 08/25/2017 06:20 I have independently reviewed the films.    Assessment & Plan:    1. Left ureteral stone S/P ESWL of left ureteral stone Some fragments on KUB - patient is asymptomatic - she is not wanting another KUB at this time RTC in one month for RUS  Stone sent for analysis Advised to contact our office or seek treatment in the ED if becomes febrile or pain/ vomiting are difficult control in order to arrange for emergent/urgent intervention  2. Left hydronephrosis obtain RUS to ensure the  hydronephrosis has resolved once they have passed and/or recovered from procedure to ensure to iatrogenic hydronephrosis remains - it is explained to the patient that it is important to document resolution of the hydronephrosis as "silent hydronephrosis" can occur and cause damage and/or loss of the kidney  3. Gross hematuria continue to monitor the patient's UA after the treatment/passage of the stone to ensure the hematuria has resolved if hematuria persists, we will pursue a hematuria workup with CT Urogram and cystoscopy if appropriate.  4. Bilateral renal cysts Will evaluate again when patient undergoes renal ultrasound after she recovers from ESWL  Return for I will call patient with results.  These notes generated with voice recognition software. I apologize for typographical errors.  Zara Council, PA-C  [facility] [address]  [place], [place] [address] [phone]

## 2017-08-26 ENCOUNTER — Encounter: Payer: Self-pay | Admitting: Urology

## 2017-08-26 ENCOUNTER — Ambulatory Visit (INDEPENDENT_AMBULATORY_CARE_PROVIDER_SITE_OTHER): Payer: Medicare Other | Admitting: Urology

## 2017-08-26 VITALS — BP 159/82 | HR 76 | Ht 64.0 in | Wt 123.0 lb

## 2017-08-26 DIAGNOSIS — R310 Gross hematuria: Secondary | ICD-10-CM

## 2017-08-26 DIAGNOSIS — N132 Hydronephrosis with renal and ureteral calculous obstruction: Secondary | ICD-10-CM

## 2017-08-26 DIAGNOSIS — N201 Calculus of ureter: Secondary | ICD-10-CM

## 2017-08-26 DIAGNOSIS — N200 Calculus of kidney: Secondary | ICD-10-CM | POA: Diagnosis not present

## 2017-08-26 DIAGNOSIS — N281 Cyst of kidney, acquired: Secondary | ICD-10-CM

## 2017-09-07 ENCOUNTER — Other Ambulatory Visit: Payer: Self-pay

## 2017-09-21 ENCOUNTER — Other Ambulatory Visit: Payer: Self-pay | Admitting: Urology

## 2017-09-24 ENCOUNTER — Ambulatory Visit: Payer: Medicare Other

## 2017-10-01 ENCOUNTER — Ambulatory Visit
Admission: RE | Admit: 2017-10-01 | Discharge: 2017-10-01 | Disposition: A | Discharge status: Home / Self Care | Payer: Medicare Other | Source: Ambulatory Visit | Attending: Urology | Admitting: Urology

## 2017-10-01 DIAGNOSIS — N281 Cyst of kidney, acquired: Secondary | ICD-10-CM | POA: Insufficient documentation

## 2017-10-01 DIAGNOSIS — N201 Calculus of ureter: Secondary | ICD-10-CM | POA: Diagnosis not present

## 2017-10-01 DIAGNOSIS — R93421 Abnormal radiologic findings on diagnostic imaging of right kidney: Secondary | ICD-10-CM | POA: Diagnosis not present

## 2017-10-02 ENCOUNTER — Telehealth: Payer: Self-pay

## 2017-10-02 NOTE — Telephone Encounter (Signed)
See my note attached to the RUS report.

## 2017-10-02 NOTE — Telephone Encounter (Signed)
[facility] called to inform you of US Renal results. Please see imaging report

## 2017-10-05 ENCOUNTER — Telehealth: Payer: Self-pay

## 2017-10-05 DIAGNOSIS — R93429 Abnormal radiologic findings on diagnostic imaging of unspecified kidney: Secondary | ICD-10-CM

## 2017-10-05 NOTE — Telephone Encounter (Signed)
-----   Message from Nori Riis, PA-C sent at 10/02/2017 10:05 AM EDT ----- Please let Mrs. Mineau know that her kidney ultrasound demonstrated an area in her right kidney that may be an infection.  We need to check an UA and urine culture for her.  If she is having flank pain or fevers, she needs to be seen or seek treatment in ED.

## 2017-10-05 NOTE — Telephone Encounter (Signed)
Called pt informed her of the information below. Pt gave verbal understanding. Denies signs and sx of UTI at this time. Scheduled pt for lab appt 10/06/17.

## 2017-10-06 ENCOUNTER — Other Ambulatory Visit: Payer: Medicare Other

## 2017-10-06 DIAGNOSIS — R93429 Abnormal radiologic findings on diagnostic imaging of unspecified kidney: Secondary | ICD-10-CM | POA: Diagnosis not present

## 2017-10-06 DIAGNOSIS — N132 Hydronephrosis with renal and ureteral calculous obstruction: Secondary | ICD-10-CM | POA: Diagnosis not present

## 2017-10-06 DIAGNOSIS — N201 Calculus of ureter: Secondary | ICD-10-CM | POA: Diagnosis not present

## 2017-10-06 DIAGNOSIS — R310 Gross hematuria: Secondary | ICD-10-CM | POA: Diagnosis not present

## 2017-10-06 LAB — MICROSCOPIC EXAMINATION
EPITHELIAL CELLS (NON RENAL): NONE SEEN /HPF (ref 0–10)
WBC, UA: NONE SEEN /hpf (ref 0–5)

## 2017-10-06 LAB — URINALYSIS, COMPLETE
Bilirubin, UA: NEGATIVE
Glucose, UA: NEGATIVE
Ketones, UA: NEGATIVE
LEUKOCYTES UA: NEGATIVE
NITRITE UA: NEGATIVE
Protein, UA: NEGATIVE
Specific Gravity, UA: 1.01 (ref 1.005–1.030)
Urobilinogen, Ur: 0.2 mg/dL (ref 0.2–1.0)
pH, UA: 7 (ref 5.0–7.5)

## 2017-10-07 ENCOUNTER — Ambulatory Visit: Payer: Self-pay | Admitting: Family Medicine

## 2017-10-09 LAB — CULTURE, URINE COMPREHENSIVE

## 2017-10-10 ENCOUNTER — Telehealth: Payer: Self-pay | Admitting: Urology

## 2017-10-10 NOTE — Telephone Encounter (Signed)
Please let Anna Mcmillan know that her urine culture was negative.  We will need to repeat her ultrasound in one month to see if this area in her kidney has resolved.

## 2017-10-13 ENCOUNTER — Encounter: Payer: Self-pay | Admitting: Family Medicine

## 2017-10-13 ENCOUNTER — Ambulatory Visit (INDEPENDENT_AMBULATORY_CARE_PROVIDER_SITE_OTHER): Payer: Medicare Other | Admitting: Family Medicine

## 2017-10-13 VITALS — BP 142/84 | HR 75 | Temp 98.2°F | Wt 129.0 lb

## 2017-10-13 DIAGNOSIS — C50912 Malignant neoplasm of unspecified site of left female breast: Secondary | ICD-10-CM

## 2017-10-13 DIAGNOSIS — Z171 Estrogen receptor negative status [ER-]: Secondary | ICD-10-CM

## 2017-10-13 DIAGNOSIS — I10 Essential (primary) hypertension: Secondary | ICD-10-CM

## 2017-10-13 NOTE — Progress Notes (Signed)
       [name]    DOB: 1945-04-06   72 y.o.   MRN: [id number] Visit Date: 10/13/2017  Today's Provider: Wilhemena Durie, MD   Chief Complaint  Patient presents with  . Hypertension    Follow visit   Subjective:    Hypertension  This is a chronic problem. The problem is unchanged. The problem is controlled (Pt reports her morning BP's are 110/50-60's.). Associated symptoms include malaise/fatigue (Especially when her BP is running low. ). Pertinent negatives include no anxiety, blurred vision, chest pain, headaches, neck pain, orthopnea, palpitations, peripheral edema, PND, shortness of breath or sweats. There are no associated agents to hypertension. There are no compliance problems.   Husband is in failing health. They seem to be coping well.     Allergies  Allergen Reactions  . Sulfa Antibiotics Anaphylaxis and Rash  . Sulfasalazine Anaphylaxis and Rash  . Statins Other (See Comments)    Severe abdominal pain.  . Tamsulosin     Dry cough     Current Outpatient Medications:  .  metoprolol succinate (TOPROL-XL) 50 MG 24 hr tablet, Take 1 tablet (50 mg total) by mouth daily., Disp: 90 tablet, Rfl: 3  Review of Systems  Constitutional: Positive for fatigue and malaise/fatigue (Especially when her BP is running low. ). Negative for activity change, appetite change, chills, diaphoresis, fever and unexpected weight change.  Eyes: Negative.  Negative for blurred vision.  Respiratory: Negative.  Negative for shortness of breath.   Cardiovascular: Negative.  Negative for chest pain, palpitations, orthopnea and PND.  Gastrointestinal: Negative.   Endocrine: Negative.   Musculoskeletal: Negative for neck pain.  Allergic/Immunologic: Negative.   Neurological: Positive for light-headedness. Negative for dizziness and headaches.  Psychiatric/Behavioral: Negative.     Social History   Tobacco Use  . Smoking status: Never Smoker  . Smokeless tobacco: Never  Used  Substance Use Topics  . Alcohol use: Yes    Comment: ocassionally wine   Objective:   BP (!) 142/84 (BP Location: Right Arm, Patient Position: Sitting, Cuff Size: Normal)   Pulse 75   Temp 98.2 F (36.8 C) (Oral)   Wt 129 lb (58.5 kg)   SpO2 98%   BMI 22.14 kg/m  Vitals:   10/13/17 1046  BP: (!) 142/84  Pulse: 75  Temp: 98.2 F (36.8 C)  TempSrc: Oral  SpO2: 98%  Weight: 129 lb (58.5 kg)     Physical Exam  Constitutional: She is oriented to person, place, and time. She appears well-developed and well-nourished.  HENT:  Head: Normocephalic and atraumatic.  Eyes: Conjunctivae are normal. No scleral icterus.  Neck: No thyromegaly present.  Cardiovascular: Normal rate, regular rhythm and normal heart sounds.  Pulmonary/Chest: Effort normal and breath sounds normal.  Abdominal: Soft.  Musculoskeletal: She exhibits no edema.  Neurological: She is alert and oriented to person, place, and time.  Skin: Skin is warm and dry.  Psychiatric: She has a normal mood and affect. Her behavior is normal. Judgment and thought content normal.        Assessment & Plan:     HTN H/o Breast Cancer     I have done the exam and reviewed the above chart and it is accurate to the best of my knowledge. Development worker, community has been used in this note in any air is in the dictation or transcription are unintentional.  Wilhemena Durie, MD  [place]

## 2017-10-14 ENCOUNTER — Telehealth: Payer: Self-pay | Admitting: Urology

## 2017-10-14 DIAGNOSIS — N201 Calculus of ureter: Secondary | ICD-10-CM

## 2017-10-14 NOTE — Telephone Encounter (Signed)
Left message for pt to call office

## 2017-10-14 NOTE — Telephone Encounter (Signed)
Please let Anna Mcmillan know that her urine culture was negative.  We will need to repeat her ultrasound in one month to see if this area in her kidney has resolved.

## 2017-10-14 NOTE — Telephone Encounter (Signed)
Spoke to patient and advised her of her urine culture results.  I also explained that we will repeat her ultrasound in 1 month.  She expressed understanding.  Please place order for renal ultrasound.

## 2017-10-14 NOTE — Telephone Encounter (Signed)
Orders placed.

## 2017-10-28 ENCOUNTER — Ambulatory Visit: Payer: Medicare Other

## 2017-11-09 ENCOUNTER — Ambulatory Visit
Admission: RE | Admit: 2017-11-09 | Discharge: 2017-11-09 | Disposition: A | Discharge status: Home / Self Care | Payer: Medicare Other | Source: Ambulatory Visit | Attending: Urology | Admitting: Urology

## 2017-11-09 DIAGNOSIS — N281 Cyst of kidney, acquired: Secondary | ICD-10-CM | POA: Diagnosis not present

## 2017-11-09 DIAGNOSIS — N201 Calculus of ureter: Secondary | ICD-10-CM | POA: Insufficient documentation

## 2017-11-18 DIAGNOSIS — D2272 Melanocytic nevi of left lower limb, including hip: Secondary | ICD-10-CM | POA: Diagnosis not present

## 2017-11-18 DIAGNOSIS — D2261 Melanocytic nevi of right upper limb, including shoulder: Secondary | ICD-10-CM | POA: Diagnosis not present

## 2017-11-18 DIAGNOSIS — D225 Melanocytic nevi of trunk: Secondary | ICD-10-CM | POA: Diagnosis not present

## 2017-11-18 DIAGNOSIS — L821 Other seborrheic keratosis: Secondary | ICD-10-CM | POA: Diagnosis not present

## 2017-11-18 DIAGNOSIS — D2262 Melanocytic nevi of left upper limb, including shoulder: Secondary | ICD-10-CM | POA: Diagnosis not present

## 2017-11-18 DIAGNOSIS — L570 Actinic keratosis: Secondary | ICD-10-CM | POA: Diagnosis not present

## 2017-11-18 DIAGNOSIS — D2271 Melanocytic nevi of right lower limb, including hip: Secondary | ICD-10-CM | POA: Diagnosis not present

## 2017-11-18 DIAGNOSIS — X32XXXA Exposure to sunlight, initial encounter: Secondary | ICD-10-CM | POA: Diagnosis not present

## 2017-11-18 DIAGNOSIS — Z08 Encounter for follow-up examination after completed treatment for malignant neoplasm: Secondary | ICD-10-CM | POA: Diagnosis not present

## 2017-11-18 DIAGNOSIS — Z85828 Personal history of other malignant neoplasm of skin: Secondary | ICD-10-CM | POA: Diagnosis not present

## 2017-12-29 ENCOUNTER — Telehealth: Payer: Self-pay

## 2017-12-29 NOTE — Telephone Encounter (Signed)
LMTCB and schedule future AWV. Needs to be scheduled after 01/20/18. -MM

## 2018-01-12 ENCOUNTER — Ambulatory Visit (INDEPENDENT_AMBULATORY_CARE_PROVIDER_SITE_OTHER): Payer: Medicare Other | Admitting: Family Medicine

## 2018-01-12 ENCOUNTER — Encounter: Payer: Self-pay | Admitting: Family Medicine

## 2018-01-12 VITALS — BP 141/84 | HR 107 | Temp 98.2°F | Resp 16 | Wt 125.0 lb

## 2018-01-12 DIAGNOSIS — R059 Cough, unspecified: Secondary | ICD-10-CM

## 2018-01-12 DIAGNOSIS — R05 Cough: Secondary | ICD-10-CM

## 2018-01-12 DIAGNOSIS — J42 Unspecified chronic bronchitis: Secondary | ICD-10-CM

## 2018-01-12 MED ORDER — DOXYCYCLINE HYCLATE 100 MG PO TABS
100.0000 mg | ORAL_TABLET | Freq: Two times a day (BID) | ORAL | 0 refills | Status: DC
Start: 2018-01-12 — End: 2018-10-20

## 2018-01-12 NOTE — Patient Instructions (Signed)
Chronic bronchitis  Rx for Doxycycline 100 mg twice daily #14 sent to pharmacy.

## 2018-01-12 NOTE — Progress Notes (Signed)
[name]    DOB: 08-21-1945   72 y.o.   MRN: [id number] Visit Date: 01/12/2018  Today's Provider: Wilhemena Durie, MD   Chief Complaint  Patient presents with  . Cough  . Otalgia   Subjective:    Cough  This is a new problem. The current episode started 1 to 4 weeks ago. The problem has been gradually worsening. The problem occurs every few minutes. The cough is productive of sputum. Associated symptoms include chills, ear congestion, ear pain, a fever, hemoptysis, nasal congestion, postnasal drip, rhinorrhea, a sore throat, shortness of breath, sweats and wheezing. Pertinent negatives include no chest pain, headaches, heartburn, myalgias, rash or weight loss. The symptoms are aggravated by lying down. Treatments tried: OTC cough medication and Advil. The treatment provided mild relief. Her past medical history is significant for bronchitis and pneumonia. There is no history of asthma, bronchiectasis, COPD, emphysema or environmental allergies.  Otalgia   Associated symptoms include coughing, rhinorrhea and a sore throat. Pertinent negatives include no headaches or rash.    Patient has had cough and congestion for 10 days. Patient states cough is productive. Patient also has symptoms of left ear pain, low grade fever, sinus pain, sinus pressure, nasal congestion, shortness of breath and wheezing. Patient has been taking Advil and OTC Cough medication with no relief.   It is of note the patient's mother died this summer and her husband died earlier this month.  Both were expected.  Allergies  Allergen Reactions  . Sulfa Antibiotics Anaphylaxis and Rash  . Sulfasalazine Anaphylaxis and Rash  . Statins Other (See Comments)    Severe abdominal pain.  . Tamsulosin     Dry cough     Current Outpatient Medications:  .  metoprolol succinate (TOPROL-XL) 50 MG 24 hr tablet, Take 1 tablet (50 mg total) by mouth daily., Disp: 90 tablet, Rfl: 3  Review of  Systems  Constitutional: Positive for chills and fever. Negative for weight loss.  HENT: Positive for congestion, ear pain, postnasal drip, rhinorrhea, sinus pressure, sinus pain and sore throat.   Respiratory: Positive for cough, hemoptysis, shortness of breath and wheezing.   Cardiovascular: Negative for chest pain.  Gastrointestinal: Negative.  Negative for heartburn.  Endocrine: Negative.   Musculoskeletal: Negative for myalgias.  Skin: Negative for rash.  Allergic/Immunologic: Negative for environmental allergies.  Neurological: Negative for headaches.    Social History   Tobacco Use  . Smoking status: Never Smoker  . Smokeless tobacco: Never Used  Substance Use Topics  . Alcohol use: Yes    Comment: ocassionally wine   Objective:   BP (!) 141/84 (BP Location: Right Arm, Patient Position: Sitting, Cuff Size: Normal)   Pulse (!) 107   Temp 98.2 F (36.8 C) (Oral)   Resp 16   Wt 125 lb (56.7 kg)   SpO2 97%   BMI 21.46 kg/m  Vitals:   01/12/18 1120  BP: (!) 141/84  Pulse: (!) 107  Resp: 16  Temp: 98.2 F (36.8 C)  TempSrc: Oral  SpO2: 97%  Weight: 125 lb (56.7 kg)     Physical Exam  Constitutional: She is oriented to person, place, and time. She appears well-developed and well-nourished.  HENT:  Head: Normocephalic and atraumatic.  Right Ear: External ear normal.  Left Ear: External ear normal.  Nose: Nose normal.  Mouth/Throat: Oropharynx is clear and moist.  Eyes: Conjunctivae are normal.  Neck: No thyromegaly present.  Cardiovascular:  Normal rate, regular rhythm and normal heart sounds.  Pulmonary/Chest: Effort normal and breath sounds normal.  Abdominal: Soft.  Musculoskeletal: She exhibits no edema.  Neurological: She is alert and oriented to person, place, and time.  Skin: Skin is warm and dry.  Psychiatric: She has a normal mood and affect. Her behavior is normal. Judgment and thought content normal.        Assessment & Plan:     1. Chronic  bronchitis, unspecified chronic bronchitis type (HCC)  - doxycycline (VIBRA-TABS) 100 MG tablet; Take 1 tablet (100 mg total) by mouth 2 (two) times daily.  Dispense: 14 tablet; Refill: 0  2. Cough       I have done the exam and reviewed the above chart and it is accurate to the best of my knowledge. Development worker, community has been used in this note in any air is in the dictation or transcription are unintentional.  Wilhemena Durie, MD  [place]

## 2018-01-13 ENCOUNTER — Other Ambulatory Visit: Payer: Self-pay

## 2018-02-03 NOTE — Telephone Encounter (Signed)
CPE scheduled for 06/02/18, needs to be scheduled prior to that. -MM

## 2018-02-09 ENCOUNTER — Other Ambulatory Visit: Payer: Self-pay | Admitting: Family Medicine

## 2018-02-09 DIAGNOSIS — R310 Gross hematuria: Secondary | ICD-10-CM

## 2018-02-10 ENCOUNTER — Other Ambulatory Visit: Payer: Medicare Other

## 2018-02-10 DIAGNOSIS — R310 Gross hematuria: Secondary | ICD-10-CM

## 2018-02-10 LAB — URINALYSIS, COMPLETE
Bilirubin, UA: NEGATIVE
Glucose, UA: NEGATIVE
Ketones, UA: NEGATIVE
Leukocytes, UA: NEGATIVE
NITRITE UA: NEGATIVE
Protein, UA: NEGATIVE
Specific Gravity, UA: 1.02 (ref 1.005–1.030)
Urobilinogen, Ur: 0.2 mg/dL (ref 0.2–1.0)
pH, UA: 6 (ref 5.0–7.5)

## 2018-02-10 LAB — MICROSCOPIC EXAMINATION
EPITHELIAL CELLS (NON RENAL): NONE SEEN /HPF (ref 0–10)
WBC, UA: NONE SEEN /hpf (ref 0–5)

## 2018-02-11 ENCOUNTER — Telehealth: Payer: Self-pay | Admitting: Family Medicine

## 2018-02-11 NOTE — Telephone Encounter (Signed)
This encounter was created in error - please disregard.

## 2018-02-11 NOTE — Telephone Encounter (Signed)
Pt returning missed call.  Please call pt back if needed. ° °Thanks, °[facility] °

## 2018-02-14 LAB — CULTURE, URINE COMPREHENSIVE

## 2018-02-15 ENCOUNTER — Telehealth: Payer: Self-pay | Admitting: Urology

## 2018-02-15 NOTE — Telephone Encounter (Signed)
Lm to cb to confirm app   Sharyn Lull

## 2018-02-15 NOTE — Telephone Encounter (Signed)
-----   Message from Royanne Foots, [place] sent at 02/15/2018  1:32 PM EST -----  ----- Message ----- From: Laneta Simmers Sent: 02/15/2018  11:27 AM EST To: [facility]  I have left a message on her home phone number, cell number and have sent a MyChart message.  She had microscopic blood in her urine sample she gave [place] last week and her urine culture was negative.  We need to get her scheduled for a cystoscopy so that we can further evaluate her for the presence  of the blood in her urine.

## 2018-03-01 NOTE — Telephone Encounter (Signed)
Scheduled AWV for 05/05/18 @ 10:20 AM. -MM

## 2018-03-03 ENCOUNTER — Encounter: Payer: Self-pay | Admitting: Urology

## 2018-03-03 ENCOUNTER — Ambulatory Visit (INDEPENDENT_AMBULATORY_CARE_PROVIDER_SITE_OTHER): Payer: Medicare Other | Admitting: Urology

## 2018-03-03 VITALS — BP 142/81 | HR 80 | Ht 64.0 in | Wt 124.4 lb

## 2018-03-03 DIAGNOSIS — R310 Gross hematuria: Secondary | ICD-10-CM | POA: Diagnosis not present

## 2018-03-03 DIAGNOSIS — R3121 Asymptomatic microscopic hematuria: Secondary | ICD-10-CM | POA: Diagnosis not present

## 2018-03-03 LAB — MICROSCOPIC EXAMINATION: Epithelial Cells (non renal): NONE SEEN /hpf (ref 0–10)

## 2018-03-03 LAB — URINALYSIS, COMPLETE
Bilirubin, UA: NEGATIVE
GLUCOSE, UA: NEGATIVE
KETONES UA: NEGATIVE
Leukocytes, UA: NEGATIVE
Nitrite, UA: NEGATIVE
PROTEIN UA: NEGATIVE
SPEC GRAV UA: 1.015 (ref 1.005–1.030)
Urobilinogen, Ur: 0.2 mg/dL (ref 0.2–1.0)
pH, UA: 7 (ref 5.0–7.5)

## 2018-03-03 NOTE — Progress Notes (Signed)
   03/03/18  CC: No chief complaint on file.   HPI: Mrs. Farran returns for cystoscopy to complete a microscopic hematuria eval.  She has a known history of stones and underwent shockwave lithotripsy last year.  She typically has 3-10 red blood cells per high-powered field.  I reviewed her CT scan from June 2019 as well as renal ultrasound from September 2019 and this revealed benign urinary tract.  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  The patient denies any gross hematuria, flank pain or stone passage.  There were no vitals taken for this visit. NED. A&Ox3.   No respiratory distress   Abd soft, NT, ND Normal external genitalia with patent urethral meatus  Cystoscopy Procedure Note  Patient identification was confirmed, informed consent was obtained, and patient was prepped using Betadine solution.  Lidocaine jelly was administered per urethral meatus.    Procedure: - Flexible cystoscope introduced, without any difficulty.   - Thorough search of the bladder revealed:    normal urethral meatus    normal urothelium    no stones    no ulcers     no tumors    no urethral polyps    no trabeculation  - Ureteral orifices were normal in position and appearance -- single right UO noted.  Retroflex revealed a normal bladder neck.  Chaperone was [place].   Post-Procedure: - Patient tolerated the procedure well  Assessment/ Plan:  Microscopic hematuria-benign evaluation.  We will see her back in 1 year or sooner if issues.   No follow-ups on file.  Festus Aloe, MD

## 2018-05-05 ENCOUNTER — Ambulatory Visit (INDEPENDENT_AMBULATORY_CARE_PROVIDER_SITE_OTHER): Payer: Medicare Other

## 2018-05-05 ENCOUNTER — Other Ambulatory Visit: Payer: Self-pay

## 2018-05-05 VITALS — BP 144/80 | HR 72 | Temp 98.2°F | Ht 64.0 in | Wt 122.8 lb

## 2018-05-05 DIAGNOSIS — Z0000 Encounter for general adult medical examination without abnormal findings: Secondary | ICD-10-CM | POA: Diagnosis not present

## 2018-05-05 DIAGNOSIS — E2839 Other primary ovarian failure: Secondary | ICD-10-CM | POA: Diagnosis not present

## 2018-05-05 NOTE — Patient Instructions (Addendum)
Anna Mcmillan , Thank you for taking time to come for your Medicare Wellness Visit. I appreciate your ongoing commitment to your health goals. Please review the following plan we discussed and let me know if I can assist you in the future.   Screening recommendations/referrals: Colonoscopy: Up to date, due 11/2022 Mammogram: Up to date, due 01/2019 Bone Density: Ordered today. Pt aware the office will call re: appt. Recommended yearly ophthalmology/optometry visit for glaucoma screening and checkup Recommended yearly dental visit for hygiene and checkup  Vaccinations: Influenza vaccine: Pt declines today.  Pneumococcal vaccine: Completed series Tdap vaccine: Pt declines today.  Shingles vaccine: Pt declines today.        Advanced directives: Please bring a copy of your POA (Power of Attorney) and/or Living Will to your next appointment.   Conditions/risks identified: Continue trying to increase water intake to 6-8 8 oz glasses a day.   Next appointment: 06/02/18 @ 9:00 AM with Dr [name]. Declined scheduling the AWV for 2021 at this time.    Preventive Care 23 Years and Older, Female Preventive care refers to lifestyle choices and visits with your health care provider that can promote health and wellness. What does preventive care include?  A yearly physical exam. This is also called an annual well check.  Dental exams once or twice a year.  Routine eye exams. Ask your health care provider how often you should have your eyes checked.  Personal lifestyle choices, including:  Daily care of your teeth and gums.  Regular physical activity.  Eating a healthy diet.  Avoiding tobacco and drug use.  Limiting alcohol use.  Practicing safe sex.  Taking low-dose aspirin every day.  Taking vitamin and mineral supplements as recommended by your health care provider. What happens during an annual well check? The services and screenings done by your health care provider during your  annual well check will depend on your age, overall health, lifestyle risk factors, and family history of disease. Counseling  Your health care provider may ask you questions about your:  Alcohol use.  Tobacco use.  Drug use.  Emotional well-being.  Home and relationship well-being.  Sexual activity.  Eating habits.  History of falls.  Memory and ability to understand (cognition).  Work and work Statistician.  Reproductive health. Screening  You may have the following tests or measurements:  Height, weight, and BMI.  Blood pressure.  Lipid and cholesterol levels. These may be checked every 5 years, or more frequently if you are over 36 years old.  Skin check.  Lung cancer screening. You may have this screening every year starting at age 26 if you have a 30-pack-year history of smoking and currently smoke or have quit within the past 15 years.  Fecal occult blood test (FOBT) of the stool. You may have this test every year starting at age 70.  Flexible sigmoidoscopy or colonoscopy. You may have a sigmoidoscopy every 5 years or a colonoscopy every 10 years starting at age 51.  Hepatitis C blood test.  Hepatitis B blood test.  Sexually transmitted disease (STD) testing.  Diabetes screening. This is done by checking your blood sugar (glucose) after you have not eaten for a while (fasting). You may have this done every 1-3 years.  Bone density scan. This is done to screen for osteoporosis. You may have this done starting at age 8.  Mammogram. This may be done every 1-2 years. Talk to your health care provider about how often you should have regular  mammograms. Talk with your health care provider about your test results, treatment options, and if necessary, the need for more tests. Vaccines  Your health care provider may recommend certain vaccines, such as:  Influenza vaccine. This is recommended every year.  Tetanus, diphtheria, and acellular pertussis (Tdap, Td)  vaccine. You may need a Td booster every 10 years.  Zoster vaccine. You may need this after age 42.  Pneumococcal 13-valent conjugate (PCV13) vaccine. One dose is recommended after age 66.  Pneumococcal polysaccharide (PPSV23) vaccine. One dose is recommended after age 4. Talk to your health care provider about which screenings and vaccines you need and how often you need them. This information is not intended to replace advice given to you by your health care provider. Make sure you discuss any questions you have with your health care provider. Document Released: 03/09/2015 Document Revised: 10/31/2015 Document Reviewed: 12/12/2014 Elsevier Interactive Patient Education  2017 [place] Prevention in the Home Falls can cause injuries. They can happen to people of all ages. There are many things you can do to make your home safe and to help prevent falls. What can I do on the outside of my home?  Regularly fix the edges of walkways and driveways and fix any cracks.  Remove anything that might make you trip as you walk through a door, such as a raised step or threshold.  Trim any bushes or trees on the path to your home.  Use bright outdoor lighting.  Clear any walking paths of anything that might make someone trip, such as rocks or tools.  Regularly check to see if handrails are loose or broken. Make sure that both sides of any steps have handrails.  Any raised decks and porches should have guardrails on the edges.  Have any leaves, snow, or ice cleared regularly.  Use sand or salt on walking paths during winter.  Clean up any spills in your garage right away. This includes oil or grease spills. What can I do in the bathroom?  Use night lights.  Install grab bars by the toilet and in the tub and shower. Do not use towel bars as grab bars.  Use non-skid mats or decals in the tub or shower.  If you need to sit down in the shower, use a plastic, non-slip stool.   Keep the floor dry. Clean up any water that spills on the floor as soon as it happens.  Remove soap buildup in the tub or shower regularly.  Attach bath mats securely with double-sided non-slip rug tape.  Do not have throw rugs and other things on the floor that can make you trip. What can I do in the bedroom?  Use night lights.  Make sure that you have a light by your bed that is easy to reach.  Do not use any sheets or blankets that are too big for your bed. They should not hang down onto the floor.  Have a firm chair that has side arms. You can use this for support while you get dressed.  Do not have throw rugs and other things on the floor that can make you trip. What can I do in the kitchen?  Clean up any spills right away.  Avoid walking on wet floors.  Keep items that you use a lot in easy-to-reach places.  If you need to reach something above you, use a strong step stool that has a grab bar.  Keep electrical cords out of the way.  Do not use floor polish or wax that makes floors slippery. If you must use wax, use non-skid floor wax.  Do not have throw rugs and other things on the floor that can make you trip. What can I do with my stairs?  Do not leave any items on the stairs.  Make sure that there are handrails on both sides of the stairs and use them. Fix handrails that are broken or loose. Make sure that handrails are as long as the stairways.  Check any carpeting to make sure that it is firmly attached to the stairs. Fix any carpet that is loose or worn.  Avoid having throw rugs at the top or bottom of the stairs. If you do have throw rugs, attach them to the floor with carpet tape.  Make sure that you have a light switch at the top of the stairs and the bottom of the stairs. If you do not have them, ask someone to add them for you. What else can I do to help prevent falls?  Wear shoes that:  Do not have high heels.  Have rubber bottoms.  Are  comfortable and fit you well.  Are closed at the toe. Do not wear sandals.  If you use a stepladder:  Make sure that it is fully opened. Do not climb a closed stepladder.  Make sure that both sides of the stepladder are locked into place.  Ask someone to hold it for you, if possible.  Clearly mark and make sure that you can see:  Any grab bars or handrails.  First and last steps.  Where the edge of each step is.  Use tools that help you move around (mobility aids) if they are needed. These include:  Canes.  Walkers.  Scooters.  Crutches.  Turn on the lights when you go into a dark area. Replace any light bulbs as soon as they burn out.  Set up your furniture so you have a clear path. Avoid moving your furniture around.  If any of your floors are uneven, fix them.  If there are any pets around you, be aware of where they are.  Review your medicines with your doctor. Some medicines can make you feel dizzy. This can increase your chance of falling. Ask your doctor what other things that you can do to help prevent falls. This information is not intended to replace advice given to you by your health care provider. Make sure you discuss any questions you have with your health care provider. Document Released: 12/07/2008 Document Revised: 07/19/2015 Document Reviewed: 03/17/2014 Elsevier Interactive Patient Education  2017 [employer].

## 2018-05-05 NOTE — Progress Notes (Signed)
Subjective:   Anna Mcmillan is a 73 y.o. female who presents for Medicare Annual (Subsequent) preventive examination.  Review of Systems:  N/A  Cardiac Risk Factors include: advanced age (>57men, >9 women);hypertension     Objective:     Vitals: BP (!) 144/80 (BP Location: Right Arm)   Pulse 72   Temp 98.2 F (36.8 C) (Oral)   Ht 5\' 4"  (1.626 m)   Wt 122 lb 12.8 oz (55.7 kg)   BMI 21.08 kg/m   Body mass index is 21.08 kg/m.  Advanced Directives 05/05/2018 01/20/2017 02/26/2016 10/31/2015 04/22/2015 04/22/2015 02/28/2015  Does Patient Have a Medical Advance Directive? Yes Yes Yes No Yes Yes Yes  Type of Paramedic of [place];Living will [place];Living will [place];Living will - Living will Living will -  Does patient want to make changes to medical advance directive? - - No - Patient declined - No - Patient declined - -  Copy of [place] in Chart? No - copy requested No - copy requested No - copy requested - No - copy requested - -    Tobacco Social History   Tobacco Use  Smoking Status Never Smoker  Smokeless Tobacco Never Used     Counseling given: Not Answered   Clinical Intake:  Pre-visit preparation completed: Yes  Pain : No/denies pain Pain Score: 0-No pain     Nutritional Status: BMI of 19-24  Normal Nutritional Risks: None  How often do you need to have someone help you when you read instructions, pamphlets, or other written materials from your doctor or pharmacy?: 1 - Never  Interpreter Needed?: No  Information entered by :: [facility], LPN  Past Medical History:  Diagnosis Date  . Arthritis   . Cancer [facility]) 2010   left breast   . Graves disease 1996  . Hypertension 1984  . Pulmonary fibrosis ([place]) 2012  . Thyroid disease    Past Surgical History:  Procedure Laterality Date  . ABDOMINAL HYSTERECTOMY  2001  . BREAST BIOPSY Left 2002  . BREAST SURGERY Left  2002   lumpectomy,   . COLONOSCOPY  2002  . COSMETIC SURGERY Left    implant 1977  . EXTRACORPOREAL SHOCK WAVE LITHOTRIPSY Left 08/13/2017   Procedure: EXTRACORPOREAL SHOCK WAVE LITHOTRIPSY (ESWL);  Surgeon: Hollice Espy, MD;  Location: [facility] ORS;  Service: Urology;  Laterality: Left;   Family History  Problem Relation Age of Onset  . Kidney disease Mother        Kidney failure/ received a kidney transplant when she was 55  . Breast cancer Daughter 15       genetic negative   Social History   Socioeconomic History  . Marital status: Widowed    Spouse name: Not on file  . Number of children: 3  . Years of education: Not on file  . Highest education level: Some college, no degree  Occupational History  . Occupation: retired  Scientific laboratory technician  . Financial resource strain: Not hard at all  . Food insecurity:    Worry: Never true    Inability: Never true  . Transportation needs:    Medical: No    Non-medical: No  Tobacco Use  . Smoking status: Never Smoker  . Smokeless tobacco: Never Used  Substance and Sexual Activity  . Alcohol use: Yes    Comment: ocassionally wine  . Drug use: No  . Sexual activity: Not on file  Lifestyle  .  Physical activity:    Days per week: 0 days    Minutes per session: 0 min  . Stress: To some extent  Relationships  . Social connections:    Talks on phone: Patient refused    Gets together: Patient refused    Attends religious service: Patient refused    Active member of club or organization: Patient refused    Attends meetings of clubs or organizations: Patient refused    Relationship status: Patient refused  Other Topics Concern  . Not on file  Social History Narrative  . Not on file    Outpatient Encounter Medications as of 05/05/2018  Medication Sig  . Biotin (BIOTIN 5000) 5 MG CAPS Take by mouth daily.  . Coenzyme Q10 (COQ10) 200 MG CAPS Take by mouth daily. Unsure of dose  . metoprolol succinate (TOPROL-XL) 50 MG 24 hr tablet Take  1 tablet (50 mg total) by mouth daily.  . mometasone (NASONEX) 50 MCG/ACT nasal spray Place 2 sprays into the nose daily. As needed  . Multiple Vitamins-Minerals (MULTIVITAMIN GUMMIES WOMENS PO) Take by mouth daily. High in D3  . doxycycline (VIBRA-TABS) 100 MG tablet Take 1 tablet (100 mg total) by mouth 2 (two) times daily. (Patient not taking: Reported on 05/05/2018)   No facility-administered encounter medications on file as of 05/05/2018.     Activities of Daily Living In your present state of health, do you have any difficulty performing the following activities: 05/05/2018  Hearing? Y  Comment Does not wear hearing aids.   Vision? Y  Comment Due to migraines and stress. Wears eye glasses daily.   Difficulty concentrating or making decisions? Y  Walking or climbing stairs? N  Dressing or bathing? N  Doing errands, shopping? N  Preparing Food and eating ? N  Using the Toilet? N  In the past six months, have you accidently leaked urine? N  Do you have problems with loss of bowel control? N  Managing your Medications? N  Managing your Finances? N  Housekeeping or managing your Housekeeping? N  Some recent data might be hidden    Patient Care Team: Jerrol Banana., MD as PCP - General (Family Medicine) Birder Robson, MD as Referring Physician (Ophthalmology)    Assessment:   This is a routine wellness examination for Anna Mcmillan.  Exercise Activities and Dietary recommendations Current Exercise Habits: Home exercise routine, Type of exercise: walking, Time (Minutes): 45, Frequency (Times/Week): 3, Weekly Exercise (Minutes/Week): 135, Intensity: Mild, Exercise limited by: None identified  Goals    . DIET - INCREASE WATER INTAKE     Recommend increasing water intake to 4-6 glasses of water a day.        Fall Risk Fall Risk  05/05/2018 01/13/2018 01/20/2017 10/31/2015 05/01/2015  Falls in the past year? 0 0 No No No  Comment - Emmi Telephone Survey: data to providers prior  to load - - -  Number falls in past yr: - - - - -  Comment - - - - -  Injury with Fall? - - - - -  Comment - - - - -   FALL RISK PREVENTION PERTAINING TO THE HOME: Any stairs in or around the home? Yes  If so, do they handrails? Yes   Home free of loose throw rugs in walkways, pet beds, electrical cords, etc? Yes  Adequate lighting in your home to reduce risk of falls? Yes   ASSISTIVE DEVICES UTILIZED TO PREVENT FALLS:  Life alert? No  Use  of a cane, walker or w/c? No  Grab bars in the bathroom? Yes  Shower chair or bench in shower? Yes  Elevated toilet seat or a handicapped toilet? Yes    TIMED UP AND GO:  Was the test performed? No .     Depression Screen PHQ 2/9 Scores 05/05/2018 01/20/2017 10/31/2015 05/01/2015  PHQ - 2 Score 1 0 0 0     Cognitive Function: Declined today.         Immunization History  Administered Date(s) Administered  . Influenza, High Dose Seasonal PF 11/22/2014, 10/31/2015, 01/20/2017  . Influenza-Unspecified 11/24/2013  . Pneumococcal Conjugate-13 05/24/2014  . Pneumococcal Polysaccharide-23 02/08/2013  . Tdap 09/24/2005    Qualifies for Shingles Vaccine? Yes . Due for Shingrix. Education has been provided regarding the importance of this vaccine. Pt has been advised to call insurance company to determine out of pocket expense. Advised may also receive vaccine at local pharmacy or Health Dept. Verbalized acceptance and understanding.  Tdap: Although this vaccine is not a covered service during a Wellness Exam, does the patient still wish to receive this vaccine today?  No .  Education has been provided regarding the importance of this vaccine. Advised may receive this vaccine at local pharmacy or Health Dept. Aware to provide a copy of the vaccination record if obtained from local pharmacy or Health Dept. Verbalized acceptance and understanding.  Flu Vaccine: Due for Flu vaccine. Does the patient want to receive this vaccine today?  No .  Education has been provided regarding the importance of this vaccine but still declined. Advised may receive this vaccine at local pharmacy or Health Dept. Aware to provide a copy of the vaccination record if obtained from local pharmacy or Health Dept. Verbalized acceptance and understanding.  Pneumococcal Vaccine: Up to date   Screening Tests Health Maintenance  Topic Date Due  . TETANUS/TDAP  09/25/2015  . DEXA SCAN  01/09/2016  . INFLUENZA VACCINE  09/24/2017  . MAMMOGRAM  01/28/2019  . COLONOSCOPY  12/15/2022  . Hepatitis C Screening  Completed  . PNA vac Low Risk Adult  Completed    Cancer Screenings:  Colorectal Screening: Completed 12/14/12. Repeat every 10 years.  Mammogram: Completed 01/27/17.   Bone Density: Completed 01/09/11. Results reflect OSTEOPENIA. Repeat every 5 years. Ordered today. Pt aware the office will call re: appt.  Lung Cancer Screening: (Low Dose CT Chest recommended if Age 109-80 years, 30 pack-year currently smoking OR have quit w/in 15years.) does not qualify.    Additional Screening:  Hepatitis C Screening: Up to date  Vision Screening: Recommended annual ophthalmology exams for early detection of glaucoma and other disorders of the eye.  Dental Screening: Recommended annual dental exams for proper oral hygiene  Community Resource Referral:  CRR required this visit?  No       Plan:  I have personally reviewed and addressed the Medicare Annual Wellness questionnaire and have noted the following in the patient's chart:  A. Medical and social history B. Use of alcohol, tobacco or illicit drugs  C. Current medications and supplements D. Functional ability and status E.  Nutritional status F.  Physical activity G. Advance directives H. List of other physicians I.  Hospitalizations, surgeries, and ER visits in previous 12 months J.  [place] such as hearing and vision if needed, cognitive and depression L. Referrals and  appointments - none  In addition, I have reviewed and discussed with patient certain preventive protocols, quality metrics, and best practice  recommendations. A written personalized care plan for preventive services as well as general preventive health recommendations were provided to patient.  See attached scanned questionnaire for additional information.   Signed,  Fabio Neighbors, LPN Nurse Health Advisor   Nurse Recommendations: Pt declined the influenza and tetanus vaccines today.

## 2018-06-02 ENCOUNTER — Encounter: Payer: Medicare Other | Admitting: Family Medicine

## 2018-07-06 ENCOUNTER — Other Ambulatory Visit: Payer: Medicare Other

## 2018-08-01 ENCOUNTER — Other Ambulatory Visit: Payer: Self-pay | Admitting: Family Medicine

## 2018-08-01 DIAGNOSIS — I10 Essential (primary) hypertension: Secondary | ICD-10-CM

## 2018-08-25 ENCOUNTER — Ambulatory Visit
Admission: RE | Admit: 2018-08-25 | Discharge: 2018-08-25 | Disposition: A | Discharge status: Home / Self Care | Payer: Medicare Other | Source: Ambulatory Visit | Attending: Family Medicine | Admitting: Family Medicine

## 2018-08-25 ENCOUNTER — Other Ambulatory Visit: Payer: Self-pay

## 2018-08-25 DIAGNOSIS — Z780 Asymptomatic menopausal state: Secondary | ICD-10-CM | POA: Diagnosis not present

## 2018-08-25 DIAGNOSIS — E2839 Other primary ovarian failure: Secondary | ICD-10-CM | POA: Diagnosis not present

## 2018-08-25 DIAGNOSIS — M810 Age-related osteoporosis without current pathological fracture: Secondary | ICD-10-CM | POA: Diagnosis not present

## 2018-09-01 ENCOUNTER — Telehealth: Payer: Self-pay

## 2018-09-01 DIAGNOSIS — M810 Age-related osteoporosis without current pathological fracture: Secondary | ICD-10-CM

## 2018-09-01 NOTE — Telephone Encounter (Signed)
-----   Message from Jerrol Banana., MD sent at 09/01/2018  8:38 AM EDT ----- Pt has progressed to some osteoporosis--would start Alendronate 70mg  weeekly if she has never taken. Repeat BMD 2 years.

## 2018-09-01 NOTE — Telephone Encounter (Signed)
[id number] 

## 2018-09-02 MED ORDER — ALENDRONATE SODIUM 70 MG PO TABS: 70.0000 mg | ORAL_TABLET | ORAL | 11 refills | Status: DC

## 2018-09-02 NOTE — Telephone Encounter (Signed)
Patient returning call. I advised her of the results and she agrees to start medication. She says she has never taken it before. Prescription sent to pharmacy.

## 2018-09-08 DIAGNOSIS — L570 Actinic keratosis: Secondary | ICD-10-CM | POA: Diagnosis not present

## 2018-09-08 DIAGNOSIS — D2262 Melanocytic nevi of left upper limb, including shoulder: Secondary | ICD-10-CM | POA: Diagnosis not present

## 2018-09-08 DIAGNOSIS — D2271 Melanocytic nevi of right lower limb, including hip: Secondary | ICD-10-CM | POA: Diagnosis not present

## 2018-09-08 DIAGNOSIS — L821 Other seborrheic keratosis: Secondary | ICD-10-CM | POA: Diagnosis not present

## 2018-09-08 DIAGNOSIS — X32XXXA Exposure to sunlight, initial encounter: Secondary | ICD-10-CM | POA: Diagnosis not present

## 2018-09-08 DIAGNOSIS — D2261 Melanocytic nevi of right upper limb, including shoulder: Secondary | ICD-10-CM | POA: Diagnosis not present

## 2018-09-08 DIAGNOSIS — D2272 Melanocytic nevi of left lower limb, including hip: Secondary | ICD-10-CM | POA: Diagnosis not present

## 2018-09-08 DIAGNOSIS — S80862A Insect bite (nonvenomous), left lower leg, initial encounter: Secondary | ICD-10-CM | POA: Diagnosis not present

## 2018-09-08 DIAGNOSIS — D225 Melanocytic nevi of trunk: Secondary | ICD-10-CM | POA: Diagnosis not present

## 2018-10-11 ENCOUNTER — Encounter: Payer: Medicare Other | Admitting: Family Medicine

## 2018-10-20 ENCOUNTER — Other Ambulatory Visit: Payer: Self-pay

## 2018-10-20 ENCOUNTER — Encounter: Payer: Self-pay | Admitting: Family Medicine

## 2018-10-20 ENCOUNTER — Ambulatory Visit (INDEPENDENT_AMBULATORY_CARE_PROVIDER_SITE_OTHER): Payer: Medicare Other | Admitting: Family Medicine

## 2018-10-20 VITALS — BP 171/98 | HR 80 | Temp 96.9°F | Resp 16 | Wt 121.0 lb

## 2018-10-20 DIAGNOSIS — Z87442 Personal history of urinary calculi: Secondary | ICD-10-CM

## 2018-10-20 DIAGNOSIS — E7800 Pure hypercholesterolemia, unspecified: Secondary | ICD-10-CM | POA: Diagnosis not present

## 2018-10-20 DIAGNOSIS — M810 Age-related osteoporosis without current pathological fracture: Secondary | ICD-10-CM | POA: Diagnosis not present

## 2018-10-20 DIAGNOSIS — C50912 Malignant neoplasm of unspecified site of left female breast: Secondary | ICD-10-CM

## 2018-10-20 DIAGNOSIS — I10 Essential (primary) hypertension: Secondary | ICD-10-CM

## 2018-10-20 NOTE — Progress Notes (Signed)
[name]    DOB: 01-12-1946   73 y.o.   MRN: [id number] Visit Date: 10/20/2018  Today's Provider: Wilhemena Durie, MD   Chief Complaint  Patient presents with  . Follow-up   Subjective:       Hypertension, follow-up:  BP Readings from Last 3 Encounters:  10/20/18 (!) 171/98  05/05/18 (!) 144/80  03/03/18 (!) 142/81    She was last seen for hypertension 1 years ago.  BP at that visit was 142/84. Management changes since that visit include none. She reports excellent compliance with treatment. She is not having side effects.  She is not exercising. She is adherent to low salt diet.   Outside blood pressures are patient reports on average 120/80. She is experiencing none.  Patient denies chest pain, chest pressure/discomfort, claudication, dyspnea, exertional chest pressure/discomfort, fatigue, irregular heart beat, lower extremity edema, near-syncope, orthopnea, palpitations, paroxysmal nocturnal dyspnea, syncope and tachypnea.   Cardiovascular risk factors include advanced age (older than 52 for men, 17 for women) and hypertension.  Use of agents associated with hypertension: none.     Weight trend: stable Wt Readings from Last 3 Encounters:  10/20/18 121 lb (54.9 kg)  05/05/18 122 lb 12.8 oz (55.7 kg)  03/03/18 124 lb 6.4 oz (56.4 kg)    Current diet: in general, a "healthy" diet    Husband died late June 01, 2017  But pt coping well. ------------------------------------------------------------------------  Allergies  Allergen Reactions  . Sulfa Antibiotics Anaphylaxis and Rash  . Sulfasalazine Anaphylaxis and Rash  . Statins Other (See Comments)    Severe abdominal pain.  . Tamsulosin     Dry cough     Current Outpatient Medications:  .  alendronate (FOSAMAX) 70 MG tablet, Take 1 tablet (70 mg total) by mouth once a week. Take with a full glass of water on an empty stomach., Disp: 4 tablet, Rfl: 11 .  metoprolol succinate  (TOPROL-XL) 50 MG 24 hr tablet, TAKE 1 TABLET BY MOUTH EVERY DAY, Disp: 90 tablet, Rfl: 3 .  mometasone (NASONEX) 50 MCG/ACT nasal spray, Place 2 sprays into the nose daily. As needed, Disp: , Rfl:  .  Multiple Vitamins-Minerals (MULTIVITAMIN GUMMIES WOMENS PO), Take by mouth daily. High in D3, Disp: , Rfl:   Review of Systems  Constitutional: Negative.   HENT: Negative.   Eyes: Negative.   Respiratory: Negative.   Cardiovascular: Negative.   Gastrointestinal: Negative.   Endocrine: Positive for heat intolerance.  Genitourinary: Negative.   Musculoskeletal: Positive for arthralgias.  Skin: Negative.   Allergic/Immunologic: Negative.   Neurological: Positive for tremors.  Hematological: Negative.   Psychiatric/Behavioral: Negative.     Social History   Tobacco Use  . Smoking status: Never Smoker  . Smokeless tobacco: Never Used  Substance Use Topics  . Alcohol use: Yes    Comment: ocassionally wine      Objective:   BP (!) 171/98   Pulse 80   Temp (!) 96.9 F (36.1 C) (Oral)   Resp 16   Wt 121 lb (54.9 kg)   BMI 20.77 kg/m  Vitals:   10/20/18 1117  BP: (!) 171/98  Pulse: 80  Resp: 16  Temp: (!) 96.9 F (36.1 C)  TempSrc: Oral  Weight: 121 lb (54.9 kg)     Physical Exam Vitals signs reviewed.  Constitutional:      Appearance: She is well-developed.  HENT:     Head: Normocephalic and atraumatic.  Right Ear: External ear normal.     Left Ear: External ear normal.  Eyes:     General: No scleral icterus.    Conjunctiva/sclera: Conjunctivae normal.  Neck:     Thyroid: No thyromegaly.  Cardiovascular:     Rate and Rhythm: Normal rate and regular rhythm.     Heart sounds: Normal heart sounds.  Pulmonary:     Effort: Pulmonary effort is normal.     Breath sounds: Normal breath sounds.  Abdominal:     Palpations: Abdomen is soft.  Skin:    General: Skin is warm and dry.  Neurological:     Mental Status: She is alert and oriented to person, place,  and time.  Psychiatric:        Behavior: Behavior normal.        Thought Content: Thought content normal.        Judgment: Judgment normal.      No results found for any visits on 10/20/18.     Assessment & Plan    1. Essential (primary) hypertension Monitor at home and bring in home BP cuff 3-4 months. - CBC with Differential/Platelet - Comprehensive metabolic panel - TSH  2. Hypercholesteremia  - Lipid panel  3. 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 History of kidney stones   5. Age-related osteoporosis without current pathological fracture More than 50% 25 minute visit spent in counseling or coordination of care      Wilhemena Durie, MD  [facility] Patient seen and examined by......, note scribed by [employer], [place]

## 2018-10-21 DIAGNOSIS — I10 Essential (primary) hypertension: Secondary | ICD-10-CM | POA: Diagnosis not present

## 2018-10-21 DIAGNOSIS — E7800 Pure hypercholesterolemia, unspecified: Secondary | ICD-10-CM | POA: Diagnosis not present

## 2018-10-22 LAB — COMPREHENSIVE METABOLIC PANEL
ALT: 21 IU/L (ref 0–32)
AST: 28 IU/L (ref 0–40)
Albumin/Globulin Ratio: 1.7 (ref 1.2–2.2)
Albumin: 4.3 g/dL (ref 3.7–4.7)
Alkaline Phosphatase: 90 IU/L (ref 39–117)
BUN/Creatinine Ratio: 13 (ref 12–28)
BUN: 11 mg/dL (ref 8–27)
Bilirubin Total: 0.3 mg/dL (ref 0.0–1.2)
CO2: 28 mmol/L (ref 20–29)
Calcium: 9.5 mg/dL (ref 8.7–10.3)
Chloride: 99 mmol/L (ref 96–106)
Creatinine, Ser: 0.85 mg/dL (ref 0.57–1.00)
GFR calc Af Amer: 78 mL/min/{1.73_m2} (ref >59)
GFR calc non Af Amer: 68 mL/min/{1.73_m2} (ref >59)
Globulin, Total: 2.5 g/dL (ref 1.5–4.5)
Glucose: 96 mg/dL (ref 65–99)
Potassium: 4 mmol/L (ref 3.5–5.2)
Sodium: 138 mmol/L (ref 134–144)
Total Protein: 6.8 g/dL (ref 6.0–8.5)

## 2018-10-22 LAB — CBC WITH DIFFERENTIAL/PLATELET
Basophils Absolute: 0 10*3/uL (ref 0.0–0.2)
Basos: 1 %
EOS (ABSOLUTE): 0.1 10*3/uL (ref 0.0–0.4)
Eos: 1 %
Hematocrit: 44.3 % (ref 34.0–46.6)
Hemoglobin: 14.6 g/dL (ref 11.1–15.9)
Immature Grans (Abs): 0 10*3/uL (ref 0.0–0.1)
Immature Granulocytes: 0 %
Lymphocytes Absolute: 1.4 10*3/uL (ref 0.7–3.1)
Lymphs: 29 %
MCH: 30.7 pg (ref 26.6–33.0)
MCHC: 33 g/dL (ref 31.5–35.7)
MCV: 93 fL (ref 79–97)
Monocytes Absolute: 0.4 10*3/uL (ref 0.1–0.9)
Monocytes: 9 %
Neutrophils Absolute: 2.8 10*3/uL (ref 1.4–7.0)
Neutrophils: 60 %
Platelets: 246 10*3/uL (ref 150–450)
RBC: 4.76 x10E6/uL (ref 3.77–5.28)
RDW: 12.5 % (ref 11.7–15.4)
WBC: 4.7 10*3/uL (ref 3.4–10.8)

## 2018-10-22 LAB — LIPID PANEL
Chol/HDL Ratio: 3.5 ratio (ref 0.0–4.4)
Cholesterol, Total: 290 mg/dL — ABNORMAL HIGH (ref 100–199)
HDL: 84 mg/dL (ref >39)
LDL Calculated: 192 mg/dL — ABNORMAL HIGH (ref 0–99)
Triglycerides: 71 mg/dL (ref 0–149)
VLDL Cholesterol Cal: 14 mg/dL (ref 5–40)

## 2018-10-22 LAB — TSH: TSH: 1.68 u[IU]/mL (ref 0.450–4.500)

## 2018-10-27 ENCOUNTER — Telehealth: Payer: Self-pay

## 2018-10-27 NOTE — Telephone Encounter (Signed)
-----   Message from Jerrol Banana., MD sent at 10/27/2018  1:10 PM EDT ----- Lab ok burt cholesterol too high. Lifestyle changes if possible.

## 2018-10-27 NOTE — Telephone Encounter (Signed)
Left message for patient to call about lab results

## 2018-10-28 ENCOUNTER — Other Ambulatory Visit: Payer: Self-pay | Admitting: Family Medicine

## 2018-11-02 NOTE — Telephone Encounter (Signed)
Unable to contact patient.  Letter mailed.

## 2019-01-28 NOTE — Progress Notes (Signed)
[name]    DOB: 07/18/1945   73 y.o.   MRN: [id number] Visit Date: 01/31/2019  Today's Provider: Wilhemena Durie, MD   Chief Complaint  Patient presents with  . Hypertension   Subjective:     Hypertension This is a chronic problem. The problem is controlled. Pertinent negatives include no chest pain, palpitations or shortness of breath. The current treatment provides mild improvement. There are no compliance problems.   She is coping well with the death of her husband in the past year. ------------------------------------------------------------------------   Hypertension, follow-up:  BP Readings from Last 3 Encounters:  01/31/19 (!) 172/81  10/20/18 (!) 171/98  05/05/18 (!) 144/80    She was last seen for hypertension 4 months ago.  BP at that visit was 171/98. Management since that visit includes Metoprolol.She reports excellent compliance with treatment. She is not having side effects.  She is not exercising. She is not adherent to low salt diet.   Outside blood pressures are 160/90 - 118/20s. She is experiencing irregular heart beat.  Patient denies chest pain, chest pressure/discomfort, claudication, dyspnea, exertional chest pressure/discomfort, fatigue, lower extremity edema, near-syncope, orthopnea, palpitations, paroxysmal nocturnal dyspnea, syncope and tachypnea.    ------------------------------------------------------------------------    Essential (primary) hypertension From 10/20/2018-advised to monitor at home and bring in home BP cuff 3-4 months. Labs ok.  Hypercholesteremia From 10/20/2018-Labs ok burt cholesterol too high. Lifestyle changes if possible.    Allergies  Allergen Reactions  . Sulfa Antibiotics Anaphylaxis and Rash  . Sulfasalazine Anaphylaxis and Rash  . Statins Other (See Comments)    Severe abdominal pain.  . Tamsulosin     Dry cough     Current Outpatient Medications:  .  Coenzyme Q10  (COQ10) 200 MG CAPS, Take by mouth., Disp: , Rfl:  .  metoprolol succinate (TOPROL-XL) 50 MG 24 hr tablet, TAKE 1 TABLET BY MOUTH EVERY DAY, Disp: 90 tablet, Rfl: 3 .  mometasone (NASONEX) 50 MCG/ACT nasal spray, PLACE 2 SPRAYS INTO THE NOSE DAILY., Disp: 17 g, Rfl: 4 .  alendronate (FOSAMAX) 70 MG tablet, Take 1 tablet (70 mg total) by mouth once a week. Take with a full glass of water on an empty stomach. (Patient not taking: Reported on 01/31/2019), Disp: 4 tablet, Rfl: 11 .  Multiple Vitamins-Minerals (MULTIVITAMIN GUMMIES WOMENS PO), Take by mouth daily. High in D3, Disp: , Rfl:   Review of Systems  Constitutional: Negative for appetite change, chills, fatigue and fever.  HENT: Positive for dental problem and mouth sores. Negative for sore throat.        Dental issues being followed by dentistry  Eyes: Negative.   Respiratory: Negative for chest tightness and shortness of breath.   Cardiovascular: Negative for chest pain and palpitations.  Gastrointestinal: Negative for abdominal pain, nausea and vomiting.  Endocrine: Negative.   Genitourinary: Negative.   Musculoskeletal: Negative.   Skin: Negative.   Allergic/Immunologic: Negative.   Neurological: Negative for dizziness and weakness.  Hematological: Negative.   Psychiatric/Behavioral: Negative.     Social History   Tobacco Use  . Smoking status: Never Smoker  . Smokeless tobacco: Never Used  Substance Use Topics  . Alcohol use: Yes    Comment: ocassionally wine      Objective:   BP (!) 172/81   Pulse 84   Temp (!) 97.1 F (36.2 C) (Temporal)   Resp 18   Ht 5\' 4"  (1.626 m)   Wt 125 lb  9.6 oz (57 kg)   BMI 21.56 kg/m  Vitals:   01/31/19 1045  BP: (!) 172/81  Pulse: 84  Resp: 18  Temp: (!) 97.1 F (36.2 C)  TempSrc: Temporal  Weight: 125 lb 9.6 oz (57 kg)  Height: 5\' 4"  (1.626 m)  Body mass index is 21.56 kg/m.   Physical Exam Vitals signs reviewed.  Constitutional:      Appearance: She is  well-developed.  HENT:     Head: Normocephalic and atraumatic.  Eyes:     General: No scleral icterus.    Conjunctiva/sclera: Conjunctivae normal.  Neck:     Thyroid: No thyromegaly.  Cardiovascular:     Rate and Rhythm: Normal rate and regular rhythm.     Heart sounds: Normal heart sounds.  Pulmonary:     Effort: Pulmonary effort is normal.     Breath sounds: Normal breath sounds.  Abdominal:     Palpations: Abdomen is soft.  Musculoskeletal:     Right lower leg: No edema.     Left lower leg: No edema.  Skin:    General: Skin is warm and dry.  Neurological:     General: No focal deficit present.     Mental Status: She is alert and oriented to person, place, and time.  Psychiatric:        Mood and Affect: Mood normal.        Behavior: Behavior normal.        Thought Content: Thought content normal.        Judgment: Judgment normal.      No results found for any visits on 01/31/19.     Assessment & Plan    1. Essential (primary) hypertension Stated amlodipine 25 mg qd. Decrease metoprolol 50 mg to 25 mg daily.  - amLODipine (NORVASC) 5 MG tablet; Take 1 tablet (5 mg total) by mouth daily.  Dispense: 30 tablet; Refill: 3 - metoprolol succinate (TOPROL-XL) 25 MG 24 hr tablet; Take 1 tablet (25 mg total) by mouth daily.  Dispense: 90 tablet; Refill: 0 Decrease metoprolol from 50 to 25 mg daily.  2. Age-related osteoporosis without current pathological fracture   3.h/o  Malignant neoplasm of left breast in female, estrogen receptor negative, unspecified site of breast ([place])   Follow up in 2 months.      Richard Cranford Mon, MD  [facility]

## 2019-01-31 ENCOUNTER — Ambulatory Visit (INDEPENDENT_AMBULATORY_CARE_PROVIDER_SITE_OTHER): Payer: Medicare Other | Admitting: Family Medicine

## 2019-01-31 ENCOUNTER — Other Ambulatory Visit: Payer: Self-pay

## 2019-01-31 ENCOUNTER — Encounter: Payer: Self-pay | Admitting: Family Medicine

## 2019-01-31 VITALS — BP 172/81 | HR 84 | Temp 97.1°F | Resp 18 | Ht 64.0 in | Wt 125.6 lb

## 2019-01-31 DIAGNOSIS — M810 Age-related osteoporosis without current pathological fracture: Secondary | ICD-10-CM | POA: Diagnosis not present

## 2019-01-31 DIAGNOSIS — I10 Essential (primary) hypertension: Secondary | ICD-10-CM | POA: Diagnosis not present

## 2019-01-31 DIAGNOSIS — C50912 Malignant neoplasm of unspecified site of left female breast: Secondary | ICD-10-CM | POA: Diagnosis not present

## 2019-01-31 DIAGNOSIS — Z171 Estrogen receptor negative status [ER-]: Secondary | ICD-10-CM | POA: Diagnosis not present

## 2019-01-31 MED ORDER — METOPROLOL SUCCINATE ER 25 MG PO TB24: 25.0000 mg | ORAL_TABLET | Freq: Every day | ORAL | 0 refills | Status: DC

## 2019-01-31 MED ORDER — AMLODIPINE BESYLATE 5 MG PO TABS: 5.0000 mg | ORAL_TABLET | Freq: Every day | ORAL | 3 refills | Status: DC

## 2019-02-15 ENCOUNTER — Other Ambulatory Visit: Payer: Self-pay | Admitting: Family Medicine

## 2019-03-02 NOTE — Progress Notes (Signed)
03/03/2019 2:56 PM   Anna Mcmillan [birth year] [id number]  Referring provider: Jerrol Mcmillan., MD [address] [place],  [place] [address]  Chief Complaint  Patient presents with  . Nephrolithiasis    HPI: [name] is a 74 year old with a history of nephrolithiasis and a history of gross hematuria who presents today for follow up.  History of nephrolithiasis On August 06, 2017 CT renal stone study noted 5 mm stone over the left UVJ causing low-grade obstruction.  Bilateral renal cysts.  1.3 cm right liver cyst unchanged.  She underwent ESWL on 08/13/2017.  Smudging of the stone was achieved during procedure.  Post procedural course was uneventful and as expected.  KUB on 08/24/2017 noted several calcifications within the pelvis. Majority represent phleboliths. Possibility of a 4 mm distal left ureteral calculus is raised.  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 No evidence of calyceal dilatation on today's exam.  2.2 cm simple cyst right kidney.  History of hematuria (high risk) Non-smoker.  CTU 07/2017 Adrenal glands are normal. Kidneys are normal in size with mild prominence of the left intrarenal collecting system. Subcentimeter hypodensity over the lower pole cortex of the left kidney. 2 cm cyst over the upper pole right kidney. Right ureter is normal. Mild dilatation of the left ureter as there is a 5 mm stone at the left UVJ causing this low-grade obstruction. Bladder is normal.  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 No evidence of calyceal dilatation on today's exam.  2.2 cm simple cyst right kidney.  Cysto 02/2018 with Dr. Junious Silk NED.  She does not report gross hematuria.  She had an episode of dark colored urine associated with left sided flank pain right before Christmas.  It was one episode and she  increased her water intake in the urine became clear and she did not have any further flank pain.  She believes it may be associated with straining as she was moving a large dining table by herself the day before.  UA 03/02/2019 negative for AMH.     PMH: Past Medical History:  Diagnosis Date  . Arthritis   . Cancer [facility]) 2010   left breast   . Graves disease 1996  . Hypertension 1984  . Pulmonary fibrosis ([place]) 2012  . Thyroid disease     Surgical History: Past Surgical History:  Procedure Laterality Date  . ABDOMINAL HYSTERECTOMY  2001  . BREAST BIOPSY Left 2002  . BREAST SURGERY Left 2002   lumpectomy,   . COLONOSCOPY  2002  . COSMETIC SURGERY Left    implant 1977  . EXTRACORPOREAL SHOCK WAVE LITHOTRIPSY Left 08/13/2017   Procedure: EXTRACORPOREAL SHOCK WAVE LITHOTRIPSY (ESWL);  Surgeon: Hollice Espy, MD;  Location: [facility] ORS;  Service: Urology;  Laterality: Left;    Home Medications:  Allergies as of 03/03/2019      Reactions   Sulfa Antibiotics Anaphylaxis, Rash   Sulfasalazine Anaphylaxis, Rash   Statins Other (See Comments)   Severe abdominal pain.   Tamsulosin    Dry cough      Medication List       Accurate as of March 03, 2019 11:59 PM. If you have any questions, ask your nurse or doctor.        STOP taking these medications   alendronate 70 MG tablet  Commonly known as: FOSAMAX Stopped by: Zara Council, PA-C     TAKE these medications   amLODipine 5 MG tablet Commonly known as: NORVASC Take 1 tablet (5 mg total) by mouth daily.   CoQ10 200 MG Caps Take by mouth.   metoprolol succinate 25 MG 24 hr tablet Commonly known as: TOPROL-XL Take 1 tablet (25 mg total) by mouth daily.   mometasone 50 MCG/ACT nasal spray Commonly known as: NASONEX PLACE 2 SPRAYS INTO THE NOSE DAILY.   MULTIVITAMIN GUMMIES WOMENS PO Take by mouth daily. High in D3       Allergies:  Allergies  Allergen Reactions  . Sulfa Antibiotics Anaphylaxis and Rash   . Sulfasalazine Anaphylaxis and Rash  . Statins Other (See Comments)    Severe abdominal pain.  . Tamsulosin     Dry cough    Family History: Family History  Problem Relation Age of Onset  . Kidney disease Mother        Kidney failure/ received a kidney transplant when she was 56  . Breast cancer Daughter 74       genetic negative    Social History:  reports that she has never smoked. She has never used smokeless tobacco. She reports current alcohol use. She reports that she does not use drugs.  ROS: UROLOGY Frequent Urination?: No Hard to postpone urination?: No Burning/pain with urination?: No Get up at night to urinate?: No Leakage of urine?: No Urine stream starts and stops?: No Trouble starting stream?: No Do you have to strain to urinate?: No Blood in urine?: No Urinary tract infection?: No Sexually transmitted disease?: No Injury to kidneys or bladder?: No Painful intercourse?: No Weak stream?: No Currently pregnant?: No Vaginal bleeding?: No Last menstrual period?: n  Gastrointestinal Nausea?: No Vomiting?: No Indigestion/heartburn?: No Diarrhea?: No Constipation?: No  Constitutional Fever: No Night sweats?: No Weight loss?: No Fatigue?: No  Skin Skin rash/lesions?: No Itching?: No  Eyes Blurred vision?: No Double vision?: No  Ears/Nose/Throat Sore throat?: No Sinus problems?: No  Hematologic/Lymphatic Swollen glands?: No Easy bruising?: No  Cardiovascular Leg swelling?: No Chest pain?: No  Respiratory Cough?: No Shortness of breath?: No  Endocrine Excessive thirst?: No  Musculoskeletal Back pain?: No Joint pain?: No  Neurological Headaches?: No Dizziness?: No  Psychologic Depression?: No Anxiety?: No  Physical Exam: BP 132/69   Pulse 96   Ht 5\' 4"  (1.626 m)   Wt 127 lb 1.6 oz (57.7 kg)   BMI 21.82 kg/m   Constitutional:  Well nourished. Alert and oriented, No acute distress. HEENT: [place] AT, moist mucus  membranes.  Trachea midline, no masses. Cardiovascular: No clubbing, cyanosis, or edema. Respiratory: Normal respiratory effort, no increased work of breathing. Neurologic: Grossly intact, no focal deficits, moving all 4 extremities. Psychiatric: Normal mood and affect.  Laboratory Data: Lab Results  Component Value Date   WBC 4.7 10/21/2018   HGB 14.6 10/21/2018   HCT 44.3 10/21/2018   MCV 93 10/21/2018   PLT 246 10/21/2018    Lab Results  Component Value Date   CREATININE 0.85 10/21/2018    No results found for: PSA  No results found for: TESTOSTERONE  No results found for: HGBA1C  Lab Results  Component Value Date   TSH 1.680 10/21/2018       Component Value Date/Time   CHOL 290 (H) 10/21/2018 0859   HDL 84 10/21/2018 0859   CHOLHDL 3.5 10/21/2018 0859   LDLCALC 192 (H) 10/21/2018 [id number]  Lab Results  Component Value Date   AST 28 10/21/2018   Lab Results  Component Value Date   ALT 21 10/21/2018   No components found for: ALKALINEPHOPHATASE No components found for: BILIRUBINTOTAL  No results found for: ESTRADIOL  Urinalysis Component     Latest Ref Rng & Units 03/03/2019  Specific Gravity, UA     1.005 - 1.030 <1.005 (L)  pH, UA     5.0 - 7.5 6.5  Color, UA     Yellow Yellow  Appearance Ur     Clear Clear  Leukocytes,UA     Negative Negative  Protein,UA     Negative/Trace Negative  Glucose, UA     Negative Negative  Ketones, UA     Negative Negative  RBC, UA     Negative Trace (A)  Bilirubin, UA     Negative Negative  Urobilinogen, Ur     0.2 - 1.0 mg/dL 0.2  Nitrite, UA     Negative Negative  Microscopic Examination      See below:   Component     Latest Ref Rng & Units 03/03/2019          WBC, UA     0 - 5 /hpf 0-5  RBC     0 - 2 /hpf 0-2  Epithelial Cells (non renal)     0 - 10 /hpf None seen  Bacteria, UA     None seen/Few None seen    I have reviewed the labs.   Assessment & Plan:    1. History  nephrolithiasis ? Left sided stone as the cause of recent pain and dark colored urine Patient is not desiring any further tests at this time as she feels well, she states she will contact us if she should experience more flank pain in the future  2. History of hematuria Hematuria work up completed in 02/2018 - findings positive for NED No report of gross hematuria  UA today negative Discussed with the patient that the dark-colored urine may have been the result of microscopic blood and discussed that it may have been the result of her straining to move the dining room table, but also may be the result of the stone or cancer I stated it would be reasonable at this time to undergo a CT urogram for further evaluation, but she declines at this time and understands the risk of a possible missed genitourinary cancer She did state that if the pain should recur or her urine becomes dark again she will contact us immediately and undergo the study RTC in one year for UA - patient to report any gross hematuria in the interim    Return in about 1 year (around 03/02/2020) for UA and office visit .  These notes generated with voice recognition software. I apologize for typographical errors.  Zara Council, PA-C  [facility] [address]  [place], [place] [address] [phone]

## 2019-03-03 ENCOUNTER — Ambulatory Visit (INDEPENDENT_AMBULATORY_CARE_PROVIDER_SITE_OTHER): Payer: Medicare Other | Admitting: Urology

## 2019-03-03 ENCOUNTER — Other Ambulatory Visit: Payer: Self-pay

## 2019-03-03 ENCOUNTER — Encounter: Payer: Self-pay | Admitting: Urology

## 2019-03-03 VITALS — BP 132/69 | HR 96 | Ht 64.0 in | Wt 127.1 lb

## 2019-03-03 DIAGNOSIS — Z87442 Personal history of urinary calculi: Secondary | ICD-10-CM

## 2019-03-03 DIAGNOSIS — Z87448 Personal history of other diseases of urinary system: Secondary | ICD-10-CM | POA: Diagnosis not present

## 2019-03-04 LAB — URINALYSIS, COMPLETE
Bilirubin, UA: NEGATIVE
Glucose, UA: NEGATIVE
Ketones, UA: NEGATIVE
Leukocytes,UA: NEGATIVE
Nitrite, UA: NEGATIVE
Protein,UA: NEGATIVE
Specific Gravity, UA: <1.005 — ABNORMAL LOW (ref 1.005–1.030)
Urobilinogen, Ur: 0.2 mg/dL (ref 0.2–1.0)
pH, UA: 6.5 (ref 5.0–7.5)

## 2019-03-04 LAB — MICROSCOPIC EXAMINATION
Bacteria, UA: NONE SEEN
Epithelial Cells (non renal): NONE SEEN /hpf (ref 0–10)

## 2019-04-05 ENCOUNTER — Other Ambulatory Visit: Payer: Self-pay

## 2019-04-05 ENCOUNTER — Ambulatory Visit (INDEPENDENT_AMBULATORY_CARE_PROVIDER_SITE_OTHER): Payer: Medicare Other | Admitting: Family Medicine

## 2019-04-05 ENCOUNTER — Encounter: Payer: Self-pay | Admitting: Family Medicine

## 2019-04-05 VITALS — BP 137/79 | HR 91 | Temp 96.9°F | Wt 127.4 lb

## 2019-04-05 DIAGNOSIS — Z171 Estrogen receptor negative status [ER-]: Secondary | ICD-10-CM

## 2019-04-05 DIAGNOSIS — I10 Essential (primary) hypertension: Secondary | ICD-10-CM

## 2019-04-05 DIAGNOSIS — M810 Age-related osteoporosis without current pathological fracture: Secondary | ICD-10-CM

## 2019-04-05 DIAGNOSIS — C50912 Malignant neoplasm of unspecified site of left female breast: Secondary | ICD-10-CM | POA: Diagnosis not present

## 2019-04-05 NOTE — Progress Notes (Signed)
[name]    DOB: 05-Oct-1945   74 y.o.   MRN: [id number] Visit Date: 04/05/2019  Today's Provider: Wilhemena Durie, MD   Chief Complaint  Patient presents with  . Hypertension   Subjective:     HPI   Patient takes 12.5 mg of Metoprolol because the 25mg  at night was causing her diastolic bp to drop to [id number] at night. Still taking the Amlodipine 25mg , says that it is working good for her.   Essential (primary) hypertension From 01/31/2019-Stated amlodipine 25 mg qd. Decreased metoprolol 50 mg to 25 mg daily.   Allergies  Allergen Reactions  . Sulfa Antibiotics Anaphylaxis and Rash  . Sulfasalazine Anaphylaxis and Rash  . Statins Other (See Comments)    Severe abdominal pain.  . Tamsulosin     Dry cough     Current Outpatient Medications:  .  amLODipine (NORVASC) 5 MG tablet, Take 1 tablet (5 mg total) by mouth daily., Disp: 30 tablet, Rfl: 3 .  Coenzyme Q10 (COQ10) 200 MG CAPS, Take by mouth., Disp: , Rfl:  .  metoprolol succinate (TOPROL-XL) 25 MG 24 hr tablet, Take 1 tablet (25 mg total) by mouth daily., Disp: 90 tablet, Rfl: 0 .  mometasone (NASONEX) 50 MCG/ACT nasal spray, PLACE 2 SPRAYS INTO THE NOSE DAILY., Disp: 17 g, Rfl: 1 .  Multiple Vitamins-Minerals (MULTIVITAMIN GUMMIES WOMENS PO), Take by mouth daily. High in D3, Disp: , Rfl:   Review of Systems  Constitutional: Negative for appetite change, chills, fatigue and fever.  HENT: Negative.   Eyes: Negative.   Respiratory: Negative for chest tightness and shortness of breath.   Cardiovascular: Negative for chest pain and palpitations.  Gastrointestinal: Negative for abdominal pain, nausea and vomiting.  Endocrine: Negative.   Allergic/Immunologic: Negative.   Neurological: Negative for dizziness and weakness.  Hematological: Negative.   Psychiatric/Behavioral: Negative.     Social History   Tobacco Use  . Smoking status: Never Smoker  . Smokeless tobacco: Never Used   Substance Use Topics  . Alcohol use: Yes    Comment: ocassionally wine      Objective:   There were no vitals taken for this visit. There were no vitals filed for this visit.There is no height or weight on file to calculate BMI.   Physical Exam Vitals reviewed.  Constitutional:      Appearance: She is well-developed.  HENT:     Head: Normocephalic and atraumatic.     Right Ear: External ear normal.     Left Ear: External ear normal.  Eyes:     General: No scleral icterus.    Conjunctiva/sclera: Conjunctivae normal.  Neck:     Thyroid: No thyromegaly.  Cardiovascular:     Rate and Rhythm: Normal rate and regular rhythm.     Heart sounds: Normal heart sounds.  Pulmonary:     Effort: Pulmonary effort is normal.     Breath sounds: Normal breath sounds.  Abdominal:     Palpations: Abdomen is soft.  Skin:    General: Skin is warm and dry.  Neurological:     General: No focal deficit present.     Mental Status: She is alert and oriented to person, place, and time.  Psychiatric:        Mood and Affect: Mood normal.        Behavior: Behavior normal.        Thought Content: Thought content normal.  Judgment: Judgment normal.      No results found for any visits on 04/05/19.     Assessment & Plan     1. Essential (primary) hypertension Good control on Toprol.Hanley Seamen pt info on getting Covid vaccine. RTC 6 months. 2. Age-related osteoporosis without current pathological fracture   3. Malignant neoplasm of left breast in female, estrogen receptor negative, unspecified site of breast ([place])    I,Kabao Leite M Claretta Kendra,acting as a scribe for Wilhemena Durie, MD.,have documented all relevant documentation on the behalf of Wilhemena Durie, MD,as directed by  Wilhemena Durie, MD while in the presence of Wilhemena Durie, MD.     Wilhemena Durie, MD  [facility]

## 2019-04-05 NOTE — Patient Instructions (Signed)
Covid Vaccine: [phone]

## 2019-04-23 ENCOUNTER — Other Ambulatory Visit: Payer: Self-pay | Admitting: Family Medicine

## 2019-04-23 DIAGNOSIS — I10 Essential (primary) hypertension: Secondary | ICD-10-CM

## 2019-04-23 NOTE — Telephone Encounter (Signed)
Requested Prescriptions  Pending Prescriptions Disp Refills  . metoprolol succinate (TOPROL-XL) 25 MG 24 hr tablet [Pharmacy Med Name: METOPROLOL SUCC ER 25 MG TAB] 90 tablet 1    Sig: TAKE 1 TABLET BY MOUTH EVERY DAY     Cardiovascular:  Beta Blockers Passed - 04/23/2019  1:15 PM      Passed - Last BP in normal range    BP Readings from Last 1 Encounters:  04/05/19 137/79         Passed - Last Heart Rate in normal range    Pulse Readings from Last 1 Encounters:  04/05/19 91         Passed - Valid encounter within last 6 months    Recent Outpatient Visits          2 weeks ago Essential (primary) hypertension   [facility] Jerrol Banana., MD   2 months ago Essential (primary) hypertension   [facility] Jerrol Banana., MD   6 months ago Essential (primary) hypertension   [facility] Jerrol Banana., MD   1 year ago Chronic bronchitis, unspecified chronic bronchitis type [facility])   [facility] Jerrol Banana., MD   1 year ago Essential (primary) hypertension   [facility] Jerrol Banana., MD      Future Appointments            In 5 months Jerrol Banana., MD [facility], [place]   In 10 months McG[place]an, Gordan Payment [facility]           . amLODipine (NORVASC) 5 MG tablet [Pharmacy Med Name: AMLODIPINE BESYLATE 5 MG TAB] 90 tablet 1    Sig: TAKE 1 TABLET BY MOUTH EVERY DAY     Cardiovascular:  Calcium Channel Blockers Passed - 04/23/2019  1:15 PM      Passed - Last BP in normal range    BP Readings from Last 1 Encounters:  04/05/19 137/79         Passed - Valid encounter within last 6 months    Recent Outpatient Visits          2 weeks ago Essential (primary) hypertension   [facility] Jerrol Banana., MD   2 months ago Essential (primary) hypertension   [facility] Jerrol Banana., MD   6 months ago Essential (primary) hypertension   [facility] Jerrol Banana., MD   1 year ago Chronic bronchitis, unspecified chronic bronchitis type [facility])   [facility] Jerrol Banana., MD   1 year ago Essential (primary) hypertension   [facility] Jerrol Banana., MD      Future Appointments            In 5 months Jerrol Banana., MD [facility], [place]   In 10 months McGowan, Gordan Payment [facility]

## 2019-04-25 DIAGNOSIS — Z1231 Encounter for screening mammogram for malignant neoplasm of breast: Secondary | ICD-10-CM | POA: Diagnosis not present

## 2019-04-25 LAB — HM MAMMOGRAPHY

## 2019-04-26 ENCOUNTER — Encounter: Payer: Self-pay | Admitting: *Deleted

## 2019-07-28 ENCOUNTER — Other Ambulatory Visit: Payer: Self-pay | Admitting: Family Medicine

## 2019-07-28 MED ORDER — MOMETASONE FUROATE 50 MCG/ACT NA SUSP: NASAL | 1 refills | Status: DC

## 2019-07-28 NOTE — Telephone Encounter (Signed)
PT needs a refill  mometasone (NASONEX) 50 MCG/ACT nasal spray [id number]  [id number] - [place], [place] - [address]  [address] [place] [address]  Phone: [phone] Fax: [phone]

## 2019-09-20 ENCOUNTER — Telehealth: Payer: Self-pay | Admitting: Family Medicine

## 2019-09-20 NOTE — Telephone Encounter (Signed)
Copied from [place] [id number]. Topic: Medicare AWV >> Sep 20, 2019 11:24 AM Cher Nakai R wrote: Reason for CRM:  Left message for patient to call back and schedule Medicare Annual Wellness Visit (AWV) either virtually or in office.  Last AWV  05/05/2018   Please schedule at anytime with [facility] Health Advisor.

## 2019-10-04 NOTE — Progress Notes (Deleted)
     Established patient visit   Patient: Anna Mcmillan   DOB: 12/01/1945   74 y.o. Female  MRN: [id number] Visit Date: 10/05/2019  Today's healthcare provider: Wilhemena Durie, MD   No chief complaint on file.  Subjective    HPI   Hypertension, follow-up  BP Readings from Last 3 Encounters:  04/05/19 137/79  03/03/19 132/69  01/31/19 (!) 172/81   Wt Readings from Last 3 Encounters:  04/05/19 127 lb 6.4 oz (57.8 kg)  03/03/19 127 lb 1.6 oz (57.7 kg)  01/31/19 125 lb 9.6 oz (57 kg)     She was last seen for hypertension 6 months ago.  BP at that visit was 137/79. Management since that visit includes; Good control on Toprol. Gave pt info on getting Covid vaccine. She reports {excellent/good/fair/poor:19665} compliance with treatment. She {is/is not:9024} having side effects. {document side effects if present:1} She {is/is not:9024} exercising. She {is/is not:9024} adherent to low salt diet.   Outside blood pressures are {enter patient reported home BP, or 'not being checked':1}.  She {does/does not:200015} smoke.  Use of agents associated with hypertension: {bp agents assoc with hypertension:511::"none"}.   ---------------------------------------------------------------------------------------------------  {Show patient history (optional):23778::" "}   Medications: Outpatient Medications Prior to Visit  Medication Sig  . amLODipine (NORVASC) 5 MG tablet TAKE 1 TABLET BY MOUTH EVERY DAY  . Coenzyme Q10 (COQ10) 200 MG CAPS Take by mouth.  . metoprolol succinate (TOPROL-XL) 25 MG 24 hr tablet TAKE 1 TABLET BY MOUTH EVERY DAY  . mometasone (NASONEX) 50 MCG/ACT nasal spray PLACE 2 SPRAYS INTO THE NOSE DAILY.  . Multiple Vitamins-Minerals (MULTIVITAMIN GUMMIES WOMENS PO) Take by mouth daily. High in D3   No facility-administered medications prior to visit.    Review of Systems  Constitutional: Negative for appetite change, chills, fatigue and fever.  Respiratory:  Negative for chest tightness and shortness of breath.   Cardiovascular: Negative for chest pain and palpitations.  Gastrointestinal: Negative for abdominal pain, nausea and vomiting.  Neurological: Negative for dizziness and weakness.    {Heme  Chem  Endocrine  Serology  Results Review (optional):23779::" "}  Objective    There were no vitals taken for this visit. {Show previous vital signs (optional):23777::" "}  Physical Exam  ***  No results found for any visits on 10/05/19.  Assessment & Plan     ***  No follow-ups on file.      {provider attestation***:1}   Wilhemena Durie, MD  [facility] [phone] (phone) [phone] (fax)  [place]

## 2019-10-05 ENCOUNTER — Ambulatory Visit: Payer: Self-pay | Admitting: Family Medicine

## 2019-10-05 ENCOUNTER — Telehealth: Payer: Self-pay

## 2019-10-05 NOTE — Telephone Encounter (Signed)
Noted  

## 2019-10-05 NOTE — Telephone Encounter (Signed)
Copied from [place] [id number]. Topic: General - Call Back - No Documentation >> Oct 05, 2019 12:41 PM Jaynie Collins D wrote: Reason for CRM: Patient had family emergency and needed to cancel appointment today, will call back when ready to reschedule.

## 2019-10-07 DIAGNOSIS — H25013 Cortical age-related cataract, bilateral: Secondary | ICD-10-CM | POA: Diagnosis not present

## 2019-10-07 DIAGNOSIS — H04129 Dry eye syndrome of unspecified lacrimal gland: Secondary | ICD-10-CM | POA: Diagnosis not present

## 2019-10-07 DIAGNOSIS — H2513 Age-related nuclear cataract, bilateral: Secondary | ICD-10-CM | POA: Diagnosis not present

## 2019-10-11 ENCOUNTER — Ambulatory Visit: Payer: Self-pay | Admitting: Family Medicine

## 2019-10-11 NOTE — Progress Notes (Signed)
I,Travelle Mcclimans,acting as a scribe for Wilhemena Durie, MD.,have documented all relevant documentation on the behalf of Wilhemena Durie, MD,as directed by  Wilhemena Durie, MD while in the presence of Wilhemena Durie, MD.   Established patient visit   Patient: Anna Mcmillan   DOB: 19-Feb-1946   74 y.o. Female  MRN: [id number] Visit Date: 10/13/2019  Today's healthcare provider: Wilhemena Durie, MD   Chief Complaint  Patient presents with  . Follow-up  . Hypertension   Subjective    HPI  Patient is feeling well.  She has not received Covid vaccines.  She is getting ready to fly across the country to pick up her stepfather to come back to [place].  I have advised her to keep strongly consider Covid vaccine. Hypertension, follow-up  BP Readings from Last 3 Encounters:  10/13/19 (!) 159/79  04/05/19 137/79  03/03/19 132/69   Wt Readings from Last 3 Encounters:  10/13/19 128 lb (58.1 kg)  04/05/19 127 lb 6.4 oz (57.8 kg)  03/03/19 127 lb 1.6 oz (57.7 kg)     She was last seen for hypertension 6 months ago.  BP at that visit was 137/79. Management since that visit includes; Good control on Toprol.Hanley Seamen pt info on getting Covid vaccine. She reports good compliance with treatment. She is not having side effects. none She is exercising. She is adherent to low salt diet.   Outside blood pressures are 117/81.  She does not smoke.  Use of agents associated with hypertension: none.   --------------------------------------------------------------------      Medications: Outpatient Medications Prior to Visit  Medication Sig  . amLODipine (NORVASC) 5 MG tablet TAKE 1 TABLET BY MOUTH EVERY DAY  . Ascorbic Acid (VITAMIN C) 1000 MG tablet Take 1,000 mg by mouth daily.  . Multiple Vitamins-Minerals (MULTIVITAMIN GUMMIES WOMENS PO) Take by mouth daily. High in D3  . Zinc 40 MG TABS Take 40 mg by mouth daily.  . [DISCONTINUED] mometasone (NASONEX) 50 MCG/ACT  nasal spray PLACE 2 SPRAYS INTO THE NOSE DAILY.  Marland Kitchen Coenzyme Q10 (COQ10) 200 MG CAPS Take by mouth. (Patient not taking: Reported on 10/13/2019)  . metoprolol succinate (TOPROL-XL) 25 MG 24 hr tablet TAKE 1 TABLET BY MOUTH EVERY DAY (Patient not taking: Reported on 10/13/2019)   No facility-administered medications prior to visit.    Review of Systems     Objective    BP (!) 159/79 (BP Location: Right Arm, Patient Position: Sitting, Cuff Size: Normal)   Pulse 89   Temp 97.9 F (36.6 C) (Other (Comment))   Resp 16   Ht 5\' 4"  (1.626 m)   Wt 128 lb (58.1 kg)   SpO2 98%   BMI 21.97 kg/m     Depression screen [facility] 2/9 10/13/2019 05/05/2018 01/20/2017  Decreased Interest 0 1 0  Down, Depressed, Hopeless 0 0 0  PHQ - 2 Score 0 1 0  Altered sleeping 0 - -  Tired, decreased energy 1 - -  Change in appetite 0 - -  Feeling bad or failure about yourself  1 - -  Trouble concentrating 0 - -  Moving slowly or fidgety/restless 1 - -  Suicidal thoughts 0 - -  PHQ-9 Score 3 - -  Difficult doing work/chores Not difficult at all - -   Fall Risk  10/13/2019 05/05/2018 01/13/2018 01/20/2017 10/31/2015  Falls in the past year? 0 0 0 No No  Comment - - Emmi Telephone Survey: data to providers  prior to load - -  Number falls in past yr: 0 - - - -  Comment - - - - -  Injury with Fall? 0 - - - -  Comment - - - - -  Follow up Falls evaluation completed - - - -    Physical Exam  BP (!) 159/79 (BP Location: Right Arm, Patient Position: Sitting, Cuff Size: Normal)   Pulse 89   Temp 97.9 F (36.6 C) (Other (Comment))   Resp 16   Ht 5\' 4"  (1.626 m)   Wt 128 lb (58.1 kg)   SpO2 98%   BMI 21.97 kg/m   General Appearance:    Alert, cooperative, no distress, appears stated age  Head:    Normocephalic, without obvious abnormality, atraumatic  Eyes:    PERRL, conjunctiva/corneas clear, EOM's intact, fundi    benign, both eyes  Ears:    Normal TM's and external ear canals, both ears  Nose:   Nares  normal, septum midline, mucosa normal, no drainage    or sinus tenderness  Throat:   Lips, mucosa, and tongue normal; teeth and gums normal  Neck:   Supple, symmetrical, trachea midline, no adenopathy;    thyroid:  no enlargement/tenderness/nodules; no carotid   bruit or JVD  Back:     Symmetric, no curvature, ROM normal, no CVA tenderness  Lungs:     Clear to auscultation bilaterally, respirations unlabored  Chest Wall:    No tenderness or deformity   Heart:    Regular rate and rhythm, S1 and S2 normal, no murmur, rub   or gallop  Breast Exam:    No tenderness, masses, or nipple abnormality  Abdomen:     Soft, non-tender, bowel sounds active all four quadrants,    no masses, no organomegaly  Genitalia:    Normal female without lesion, discharge or tenderness  Rectal:    Normal tone, normal prostate, no masses or tenderness;   guaiac negative stool  Extremities:   Extremities normal, atraumatic, no cyanosis or edema  Pulses:   2+ and symmetric all extremities  Skin:   Skin color, texture, turgor normal, no rashes or lesions  Lymph nodes:   Cervical, supraclavicular, and axillary nodes normal  Neurologic:   CNII-XII intact, normal strength, sensation and reflexes    throughout     No results found for any visits on 10/13/19.  Assessment & Plan     1. Essential (primary) hypertension [place] hypertension.  Blood pressure is good at home. - mometasone (NASONEX) 50 MCG/ACT nasal spray; PLACE 2 SPRAYS INTO THE NOSTRIL ONCE DAILY.  Dispense: 17 g; Refill: 12 - Lipid panel - TSH - CBC w/Diff/Platelet - Comprehensive Metabolic Panel (CMET)  2. Hypercholesteremia  - mometasone (NASONEX) 50 MCG/ACT nasal spray; PLACE 2 SPRAYS INTO THE NOSTRIL ONCE DAILY.  Dispense: 17 g; Refill: 12 - Lipid panel - TSH - CBC w/Diff/Platelet - Comprehensive Metabolic Panel (CMET)  3. Allergic rhinitis, unspecified seasonality, unspecified trigger  - Lipid panel - TSH - CBC w/Diff/Platelet -  Comprehensive Metabolic Panel (CMET)  4. Age-related osteoporosis without current pathological fracture  - Lipid panel - TSH - CBC w/Diff/Platelet - Comprehensive Metabolic Panel (CMET)   Return in about 6 months (around 04/14/2020) for CPE.         Richard Cranford Mon, MD  [facility] [phone] (phone) [phone] (fax)  [place]

## 2019-10-13 ENCOUNTER — Encounter: Payer: Self-pay | Admitting: Family Medicine

## 2019-10-13 ENCOUNTER — Other Ambulatory Visit: Payer: Self-pay

## 2019-10-13 ENCOUNTER — Ambulatory Visit (INDEPENDENT_AMBULATORY_CARE_PROVIDER_SITE_OTHER): Payer: Medicare Other | Admitting: Family Medicine

## 2019-10-13 VITALS — BP 159/79 | HR 89 | Temp 97.9°F | Resp 16 | Ht 64.0 in | Wt 128.0 lb

## 2019-10-13 DIAGNOSIS — E7800 Pure hypercholesterolemia, unspecified: Secondary | ICD-10-CM | POA: Diagnosis not present

## 2019-10-13 DIAGNOSIS — M810 Age-related osteoporosis without current pathological fracture: Secondary | ICD-10-CM

## 2019-10-13 DIAGNOSIS — J309 Allergic rhinitis, unspecified: Secondary | ICD-10-CM

## 2019-10-13 DIAGNOSIS — I10 Essential (primary) hypertension: Secondary | ICD-10-CM | POA: Diagnosis not present

## 2019-10-13 MED ORDER — MOMETASONE FUROATE 50 MCG/ACT NA SUSP: NASAL | 12 refills | Status: DC

## 2019-10-14 ENCOUNTER — Other Ambulatory Visit: Payer: Self-pay | Admitting: Family Medicine

## 2019-10-14 DIAGNOSIS — I10 Essential (primary) hypertension: Secondary | ICD-10-CM

## 2019-10-14 LAB — CBC WITH DIFFERENTIAL/PLATELET
Basophils Absolute: 0 10*3/uL (ref 0.0–0.2)
Basos: 1 %
EOS (ABSOLUTE): 0 10*3/uL (ref 0.0–0.4)
Eos: 0 %
Hematocrit: 43.6 % (ref 34.0–46.6)
Hemoglobin: 14.8 g/dL (ref 11.1–15.9)
Immature Grans (Abs): 0 10*3/uL (ref 0.0–0.1)
Immature Granulocytes: 0 %
Lymphocytes Absolute: 1.1 10*3/uL (ref 0.7–3.1)
Lymphs: 18 %
MCH: 31 pg (ref 26.6–33.0)
MCHC: 33.9 g/dL (ref 31.5–35.7)
MCV: 91 fL (ref 79–97)
Monocytes Absolute: 0.4 10*3/uL (ref 0.1–0.9)
Monocytes: 7 %
Neutrophils Absolute: 4.4 10*3/uL (ref 1.4–7.0)
Neutrophils: 74 %
Platelets: 255 10*3/uL (ref 150–450)
RBC: 4.78 x10E6/uL (ref 3.77–5.28)
RDW: 12.2 % (ref 11.7–15.4)
WBC: 5.9 10*3/uL (ref 3.4–10.8)

## 2019-10-14 LAB — COMPREHENSIVE METABOLIC PANEL
ALT: 16 IU/L (ref 0–32)
AST: 23 IU/L (ref 0–40)
Albumin/Globulin Ratio: 1.6 (ref 1.2–2.2)
Albumin: 4.6 g/dL (ref 3.7–4.7)
Alkaline Phosphatase: 107 IU/L (ref 48–121)
BUN/Creatinine Ratio: 18 (ref 12–28)
BUN: 14 mg/dL (ref 8–27)
Bilirubin Total: 0.2 mg/dL (ref 0.0–1.2)
CO2: 23 mmol/L (ref 20–29)
Calcium: 9.6 mg/dL (ref 8.7–10.3)
Chloride: 103 mmol/L (ref 96–106)
Creatinine, Ser: 0.78 mg/dL (ref 0.57–1.00)
GFR calc Af Amer: 86 mL/min/{1.73_m2} (ref >59)
GFR calc non Af Amer: 75 mL/min/{1.73_m2} (ref >59)
Globulin, Total: 2.9 g/dL (ref 1.5–4.5)
Glucose: 107 mg/dL — ABNORMAL HIGH (ref 65–99)
Potassium: 4.3 mmol/L (ref 3.5–5.2)
Sodium: 144 mmol/L (ref 134–144)
Total Protein: 7.5 g/dL (ref 6.0–8.5)

## 2019-10-14 LAB — LIPID PANEL
Chol/HDL Ratio: 3.7 ratio (ref 0.0–4.4)
Cholesterol, Total: 314 mg/dL — ABNORMAL HIGH (ref 100–199)
HDL: 84 mg/dL (ref >39)
LDL Chol Calc (NIH): 222 mg/dL — ABNORMAL HIGH (ref 0–99)
Triglycerides: 62 mg/dL (ref 0–149)
VLDL Cholesterol Cal: 8 mg/dL (ref 5–40)

## 2019-10-14 LAB — TSH: TSH: 1.18 u[IU]/mL (ref 0.450–4.500)

## 2019-10-20 ENCOUNTER — Telehealth: Payer: Self-pay

## 2019-10-20 DIAGNOSIS — E7800 Pure hypercholesterolemia, unspecified: Secondary | ICD-10-CM

## 2019-10-20 NOTE — Telephone Encounter (Signed)
Left message for patient to call office back okay for [facility] triage nurse to advise. KW

## 2019-10-20 NOTE — Telephone Encounter (Signed)
-----   Message from Jerrol Banana., MD sent at 10/20/2019 12:46 PM EDT ----- Labs okay but cholesterol too high.  We will start Zetia 10 mg every morning which is not a statin.  Follow-up in 1 to 3 months

## 2019-10-21 MED ORDER — EZETIMIBE 10 MG PO TABS: 10.0000 mg | ORAL_TABLET | Freq: Every day | ORAL | 1 refills | Status: DC

## 2019-10-21 NOTE — Telephone Encounter (Signed)
Patient given results as noted by Dr. [name] on 10/20/19, patient verbalized understanding and is agreeable to starting on Zetia.

## 2019-10-21 NOTE — Telephone Encounter (Signed)
Zetia sent to patient's pharmacy.

## 2019-10-22 ENCOUNTER — Other Ambulatory Visit: Payer: Self-pay | Admitting: Family Medicine

## 2019-10-22 DIAGNOSIS — I10 Essential (primary) hypertension: Secondary | ICD-10-CM

## 2019-10-25 ENCOUNTER — Telehealth: Payer: Self-pay | Admitting: Family Medicine

## 2019-10-25 NOTE — Telephone Encounter (Signed)
Copied from [place] [id number]. Topic: Medicare AWV >> Oct 25, 2019  1:58 PM Cher Nakai R wrote: Reason for CRM:  NO answer unable to leave message  for patient to call back and schedule Medicare Annual Wellness Visit (AWV) either virtually or in office.  Last AWV  05/05/2018  Please schedule at anytime with [facility] Health Advisor.  If any questions, please contact me at [phone]

## 2019-12-01 IMAGING — CR DG CHEST 2V
2 series · 2 of 2 positions shown · non-contrast
Comparison: 12/09/2011

CLINICAL DATA: Left-sided chest pain.

EXAM:
CHEST - 2 VIEW

[chest pa]
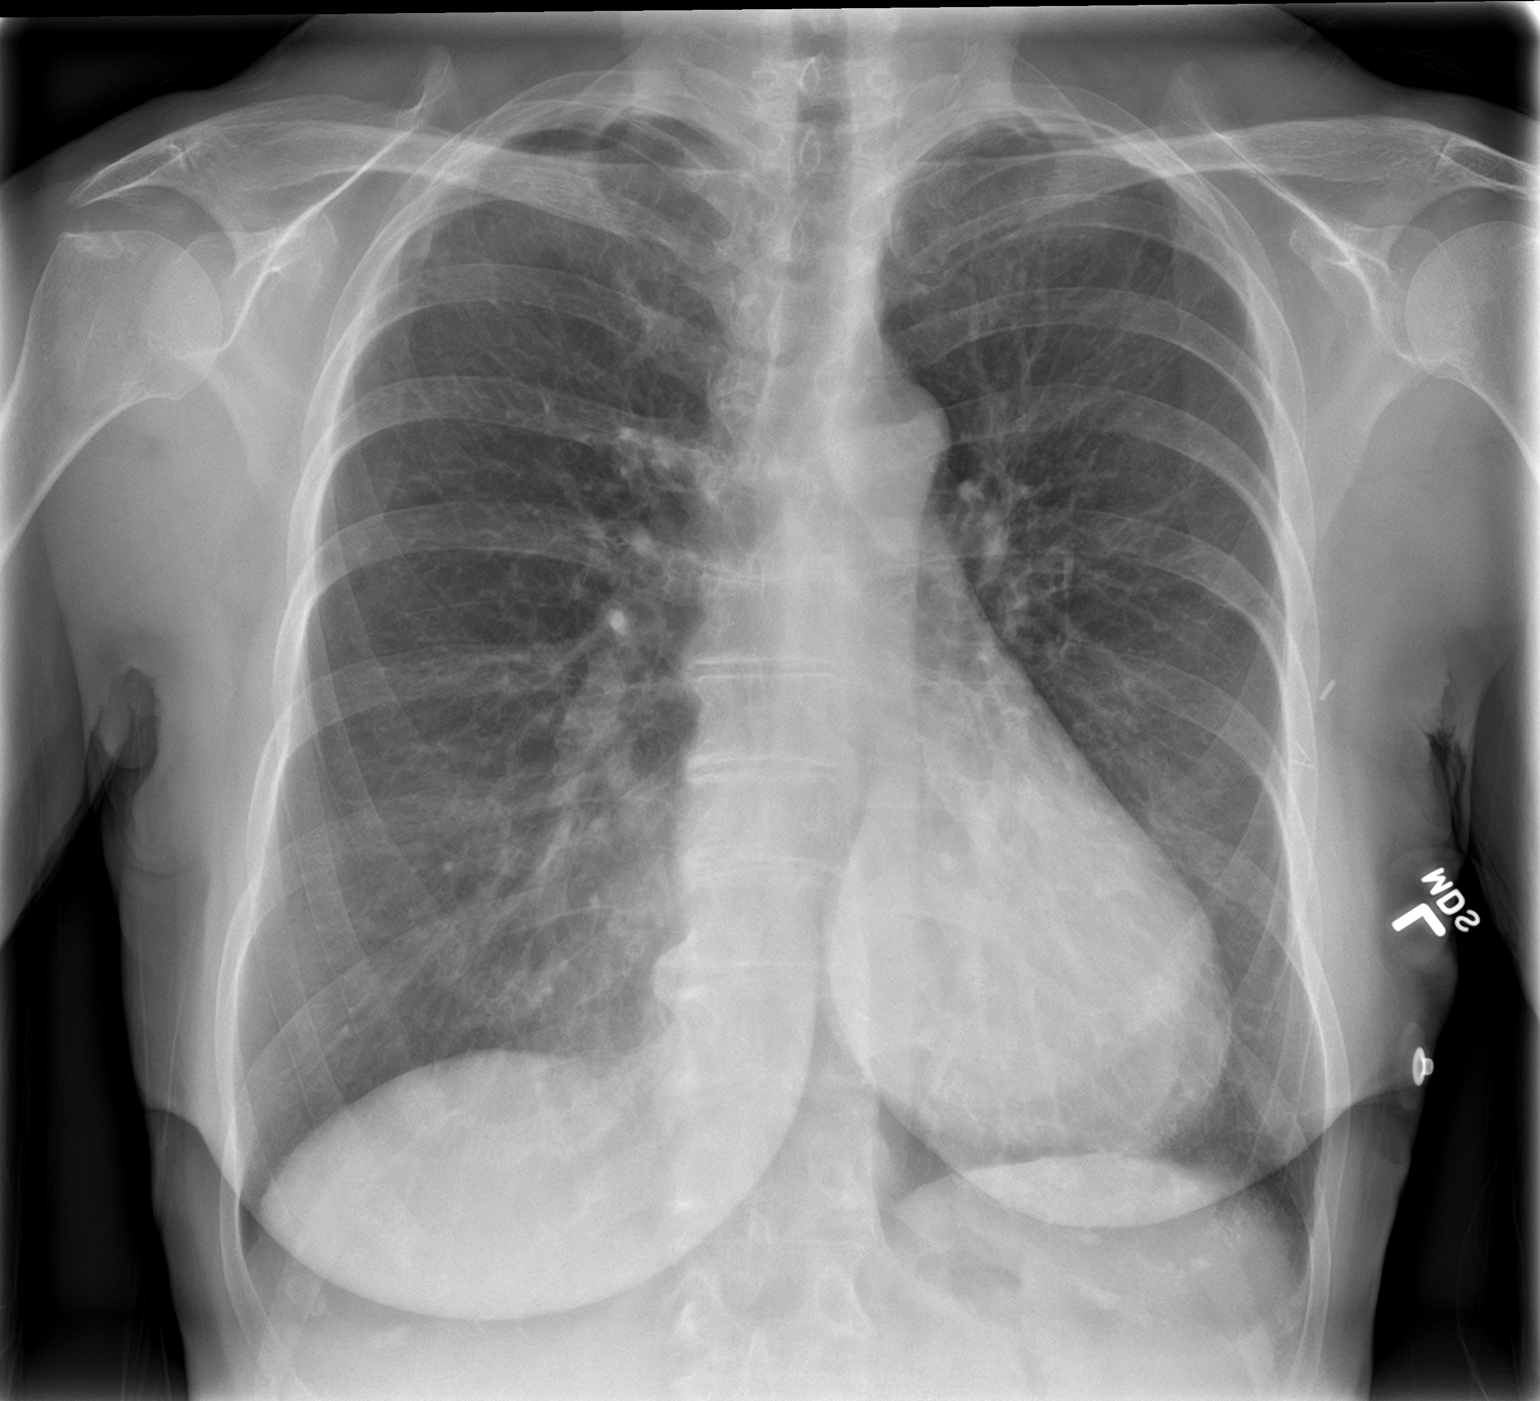

[chest lat]
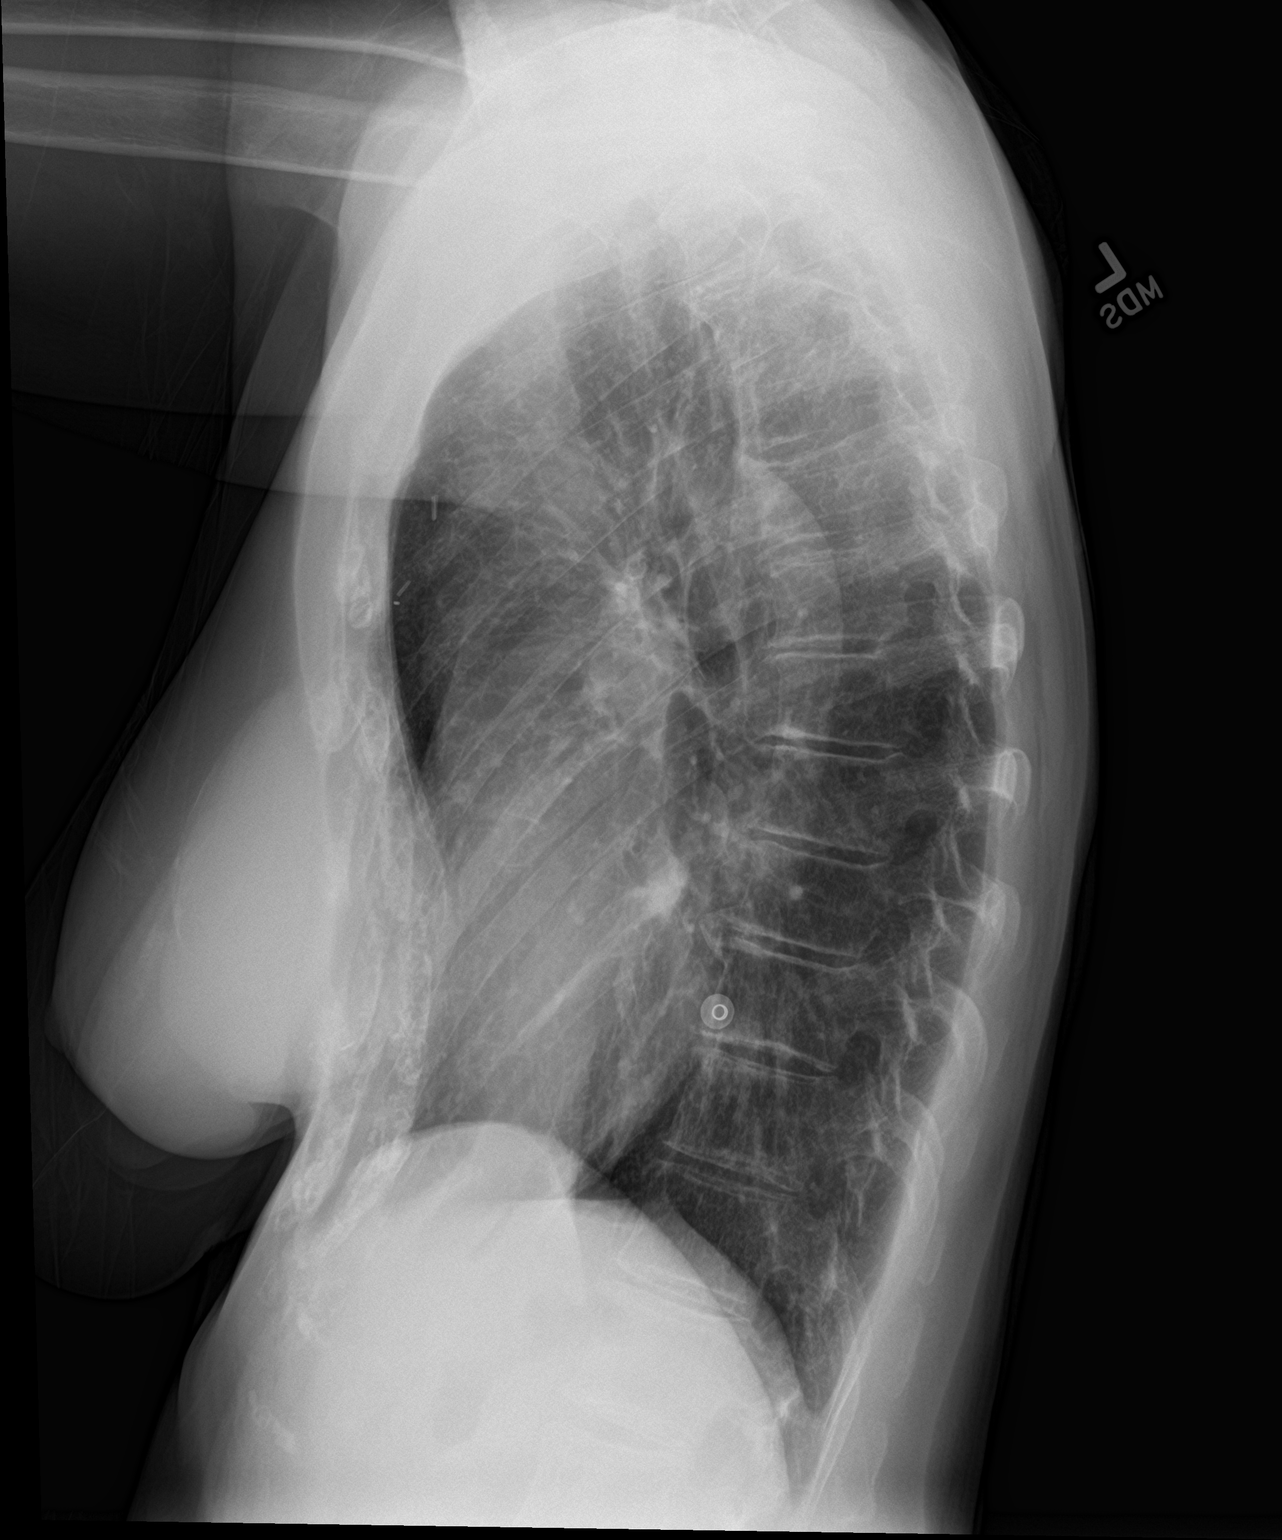

[2 of 2 positions shown; findings below may reference images not displayed]

FINDINGS: Heart size is normal. Mediastinal shadows are normal. Increased
chronic lung markings appear similar. No sign of active infiltrate,
mass, effusion or collapse. Previous chest wall surgery as seen
previously.
IMPRESSION: No active disease.  Chronic lung markings.

## 2019-12-19 IMAGING — CR DG ABDOMEN 1V
1 series · 2 of 2 positions shown · non-contrast
Comparison: 08/13/2017 plain film exam.  08/06/2017 CT.

CLINICAL DATA: 72-year-old female with left ureteral stone post
lithotripsy. Continued left flank pain. Subsequent encounter.

EXAM:
ABDOMEN - 1 VIEW

[Series 1: dg abd 1 view · 0.14mm/px · 2 of 2 slices shown]
[im 1/2]
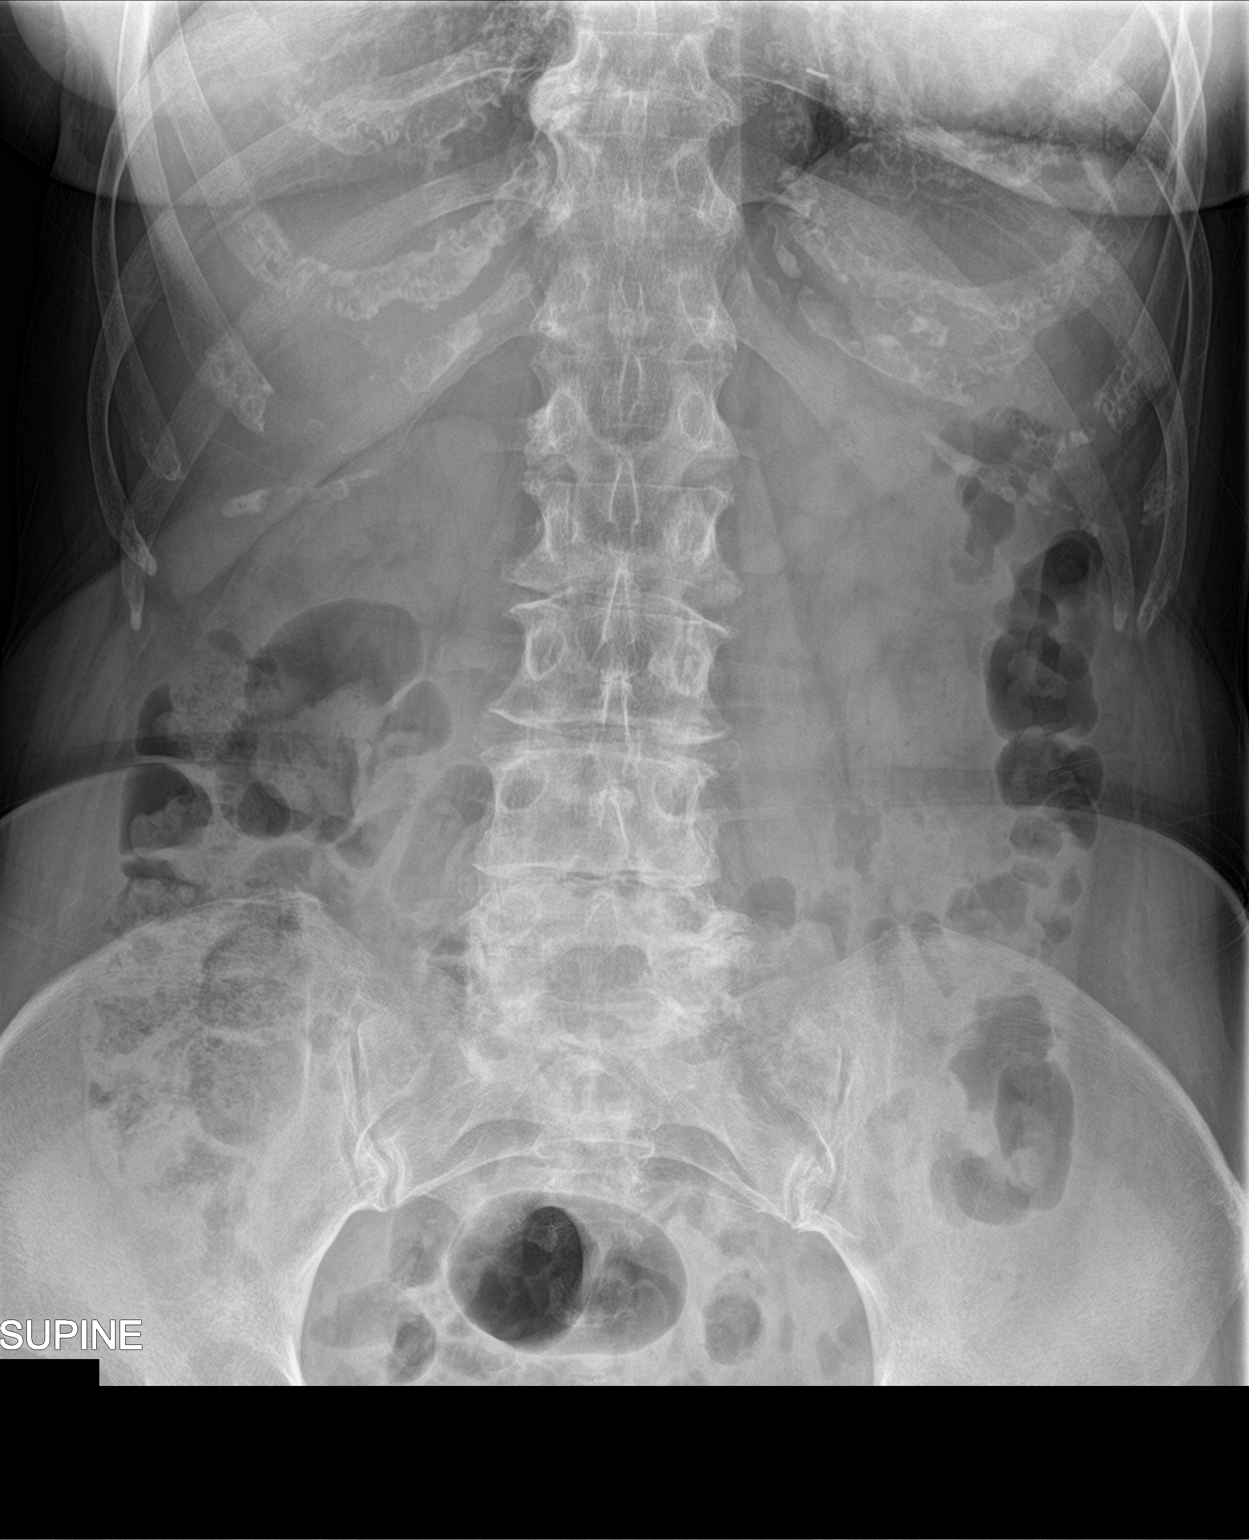
[im 2/2]
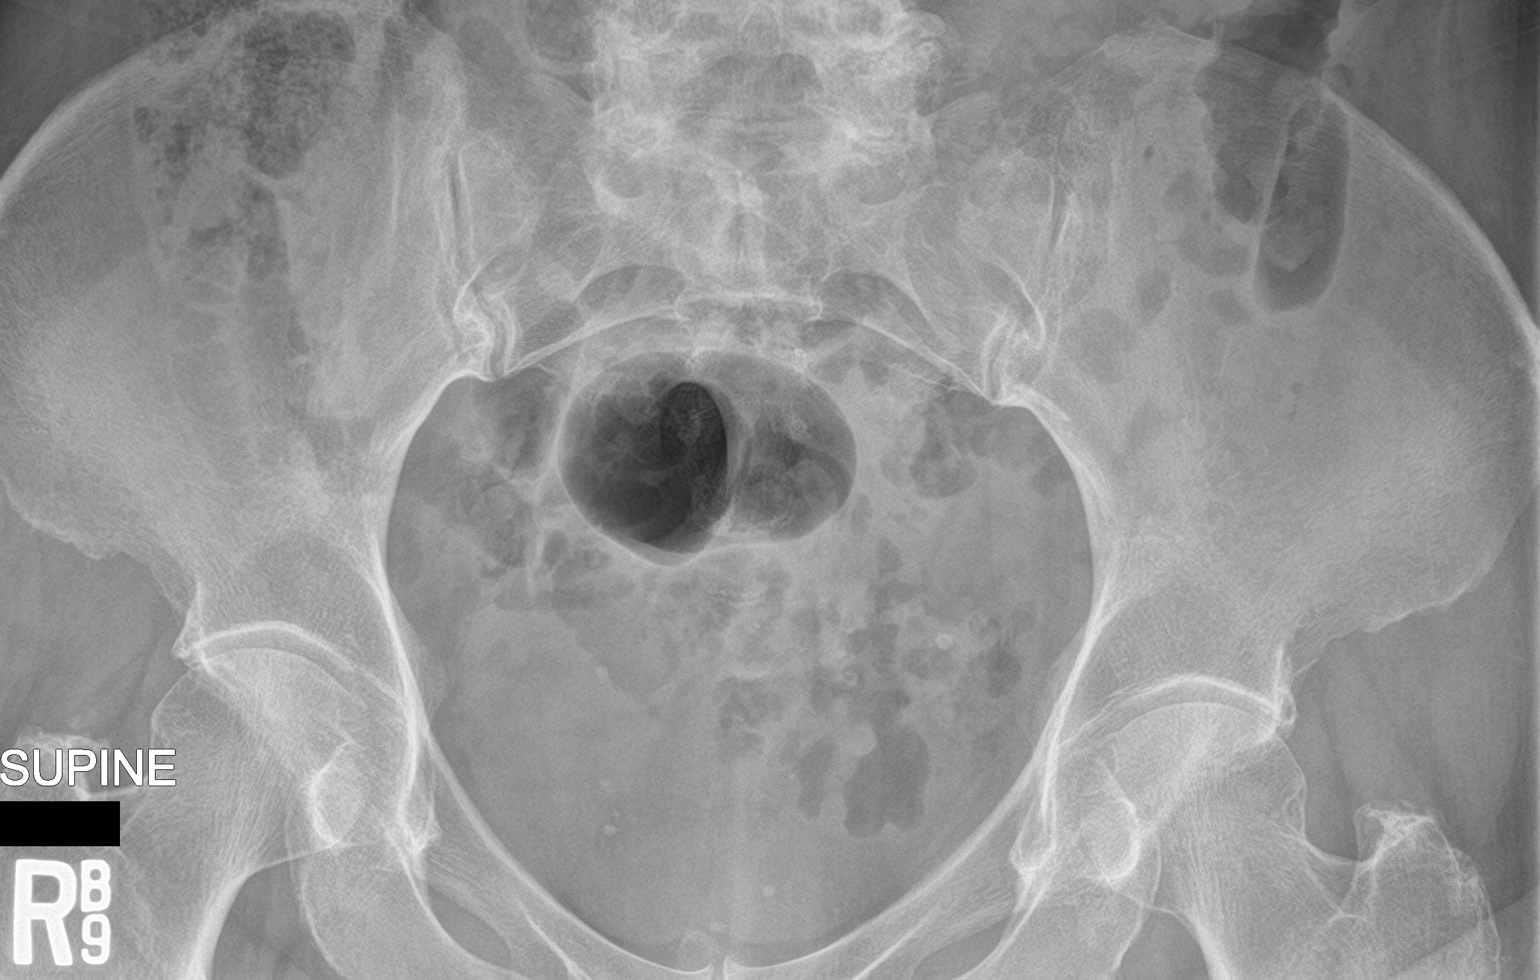

[2 of 2 positions shown; findings below may reference images not displayed]

FINDINGS: Several calcifications within the pelvis. Majority represent
phleboliths. Possibility of a 4 mm distal left ureteral calculus is
raised.

Normal bowel gas pattern.

No acute osseous abnormality.
IMPRESSION: Several calcifications within the pelvis. Majority represent
phleboliths. Possibility of a 4 mm distal left ureteral calculus is
raised.

## 2020-03-01 NOTE — Progress Notes (Deleted)
03/02/2020 8:12 AM   Anna Mcmillan 09-19-1945 [id number]  Referring provider: Maple Hudson., MD [address] [place],  [place] [address]  No chief complaint on file.   HPI: Urological history 1. Nephrolithiasis On August 06, 2017 CT renal stone study noted 5 mm stone over the left UVJ causing low-grade obstruction.  Bilateral renal cysts.  1.3 cm right liver cyst unchanged.  She underwent ESWL on 08/13/2017.  Smudging of the stone was achieved during procedure.   Post procedural course was uneventful and as expected.  KUB on 08/24/2017 noted several calcifications within the pelvis. Majority represent phleboliths. Possibility of a 4 mm distal left ureteral calculus is raised.  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  No evidence of calyceal dilatation on today's exam.  2.2 cm simple cyst right kidney.  Stone composition calcium oxalate monohydrate 53%, calcium oxalate dihydrate 20% and calcium phosphate carbonate 2 %.  2. High risk hematuria Non-smoker.  CTU 07/2017 Adrenal glands are normal. Kidneys are normal in size with mild prominence of the left intrarenal collecting system. Subcentimeter hypodensity over the lower pole cortex of the left kidney. 2 cm cyst over the upper pole right kidney. Right ureter is normal. Mild dilatation of the left ureter as there is a 5 mm stone at the left UVJ causing this low-grade obstruction. Bladder is normal.  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 No evidence of calyceal dilatation on today's exam.  2.2 cm simple cyst right kidney.  Cysto 02/2018 with Dr. Mena Goes NED.  ***    PMH: Past Medical History:  Diagnosis Date  . Arthritis   . Cancer [facility]) 2010   left breast   . Graves disease 1996  . Hypertension 1984  . Pulmonary fibrosis (HCC) 2012  . Thyroid disease      Surgical History: Past Surgical History:  Procedure Laterality Date  . ABDOMINAL HYSTERECTOMY  2001  . BREAST BIOPSY Left 2002  . BREAST SURGERY Left 2002   lumpectomy,   . COLONOSCOPY  2002  . COSMETIC SURGERY Left    implant 1977  . EXTRACORPOREAL SHOCK WAVE LITHOTRIPSY Left 08/13/2017   Procedure: EXTRACORPOREAL SHOCK WAVE LITHOTRIPSY (ESWL);  Surgeon: Vanna Scotland, MD;  Location: [facility] ORS;  Service: Urology;  Laterality: Left;    Home Medications:  Allergies as of 03/02/2020      Reactions   Sulfa Antibiotics Anaphylaxis, Rash   Sulfasalazine Anaphylaxis, Rash   Statins Other (See Comments)   Severe abdominal pain.   Tamsulosin    Dry cough      Medication List       Accurate as of March 01, 2020  8:12 AM. If you have any questions, ask your nurse or doctor.        amLODipine 5 MG tablet Commonly known as: NORVASC TAKE 1 TABLET BY MOUTH EVERY DAY   CoQ10 200 MG Caps Take by mouth.   ezetimibe 10 MG tablet Commonly known as: Zetia Take 1 tablet (10 mg total) by mouth daily.   metoprolol succinate 25 MG 24 hr tablet Commonly known as: TOPROL-XL TAKE 1 TABLET BY MOUTH EVERY DAY   mometasone 50 MCG/ACT nasal spray Commonly known as: NASONEX PLACE 2 SPRAYS INTO THE NOSTRIL ONCE DAILY.   MULTIVITAMIN GUMMIES WOMENS PO Take by mouth daily. High in D3  vitamin C 1000 MG tablet Take 1,000 mg by mouth daily.   Zinc 40 MG Tabs Take 40 mg by mouth daily.       Allergies:  Allergies  Allergen Reactions  . Sulfa Antibiotics Anaphylaxis and Rash  . Sulfasalazine Anaphylaxis and Rash  . Statins Other (See Comments)    Severe abdominal pain.  . Tamsulosin     Dry cough    Family History: Family History  Problem Relation Age of Onset  . Kidney disease Mother        Kidney failure/ received a kidney transplant when she was 68  . Breast cancer Daughter 58       genetic negative    Social History:  reports that she has never smoked. She has  never used smokeless tobacco. She reports current alcohol use. She reports that she does not use drugs.  Pertinent ROS in HPI  Physical Exam: There were no vitals taken for this visit.  Constitutional:  Well nourished. Alert and oriented, No acute distress. HEENT: [place] AT, moist mucus membranes.  Trachea midline, no masses. Cardiovascular: No clubbing, cyanosis, or edema. Respiratory: Normal respiratory effort, no increased work of breathing. GI: Abdomen is soft, non tender, non distended, no abdominal masses. Liver and spleen not palpable.  No hernias appreciated.  Stool sample for occult testing is not indicated.   GU: No CVA tenderness.  No bladder fullness or masses.  *** external genitalia, *** pubic hair distribution, no lesions.  Normal urethral meatus, no lesions, no prolapse, no discharge.   No urethral masses, tenderness and/or tenderness. No bladder fullness, tenderness or masses. *** vagina mucosa, *** estrogen effect, no discharge, no lesions, *** pelvic support, *** cystocele and *** rectocele noted.  No cervical motion tenderness.  Uterus is freely mobile and non-fixed.  No adnexal/parametria masses or tenderness noted.  Anus and perineum are without rashes or lesions.   ***  Skin: No rashes, bruises or suspicious lesions. Lymph: No cervical or inguinal adenopathy. Neurologic: Grossly intact, no focal deficits, moving all 4 extremities. Psychiatric: Normal mood and affect.   Laboratory Data: Lab Results  Component Value Date   WBC 5.9 10/13/2019   HGB 14.8 10/13/2019   HCT 43.6 10/13/2019   MCV 91 10/13/2019   PLT 255 10/13/2019    Lab Results  Component Value Date   CREATININE 0.78 10/13/2019    Lab Results  Component Value Date   TSH 1.180 10/13/2019       Component Value Date/Time   CHOL 314 (H) 10/13/2019 0931   HDL 84 10/13/2019 0931   CHOLHDL 3.7 10/13/2019 0931   LDLCALC 222 (H) 10/13/2019 0931    Lab Results  Component Value Date   AST 23 10/13/2019    Lab Results  Component Value Date   ALT 16 10/13/2019   Urinalysis ***   I have reviewed the labs.   Assessment & Plan:    1. History nephrolithiasis ? Left sided stone as the cause of recent pain and dark colored urine Patient is not desiring any further tests at this time as she feels well, she states she will contact us if she should experience more flank pain in the future  2. History of hematuria Hematuria work up completed in 02/2018 - findings positive for NED No report of gross hematuria  UA today negative Discussed with the patient that the dark-colored urine may have been the result of microscopic blood and discussed that it may have been the result of  her straining to move the dining room table, but also may be the result of the stone or cancer I stated it would be reasonable at this time to undergo a CT urogram for further evaluation, but she declines at this time and understands the risk of a possible missed genitourinary cancer She did state that if the pain should recur or her urine becomes dark again she will contact us immediately and undergo the study RTC in one year for UA - patient to report any gross hematuria in the interim    No follow-ups on file.  These notes generated with voice recognition software. I apologize for typographical errors.  Zara Council, PA-C  [facility] [address]  [place], [place] [address] [phone]

## 2020-03-02 ENCOUNTER — Encounter: Payer: Self-pay | Admitting: Urology

## 2020-03-02 ENCOUNTER — Ambulatory Visit: Payer: Medicare Other | Admitting: Urology

## 2020-03-02 DIAGNOSIS — R319 Hematuria, unspecified: Secondary | ICD-10-CM

## 2020-04-18 ENCOUNTER — Encounter: Payer: Self-pay | Admitting: Family Medicine

## 2020-04-26 ENCOUNTER — Other Ambulatory Visit: Payer: Self-pay | Admitting: Family Medicine

## 2020-04-26 DIAGNOSIS — I10 Essential (primary) hypertension: Secondary | ICD-10-CM

## 2020-05-14 ENCOUNTER — Telehealth: Payer: Self-pay | Admitting: Urology

## 2020-05-14 NOTE — Telephone Encounter (Signed)
Please call Anna Mcmillan and have her follow-up for her missed appointment in January for microscopic hematuria.

## 2020-05-14 NOTE — Telephone Encounter (Signed)
Spoke with patient and advised results, pt states she feels fine and will call as needed.

## 2020-07-18 ENCOUNTER — Encounter: Payer: Self-pay | Admitting: Family Medicine

## 2020-07-18 ENCOUNTER — Other Ambulatory Visit: Payer: Self-pay

## 2020-07-18 ENCOUNTER — Ambulatory Visit (INDEPENDENT_AMBULATORY_CARE_PROVIDER_SITE_OTHER): Payer: Medicare Other | Admitting: Family Medicine

## 2020-07-18 VITALS — BP 125/80 | HR 93 | Resp 16 | Ht 64.0 in | Wt 116.0 lb

## 2020-07-18 DIAGNOSIS — Z0000 Encounter for general adult medical examination without abnormal findings: Secondary | ICD-10-CM

## 2020-07-18 DIAGNOSIS — N952 Postmenopausal atrophic vaginitis: Secondary | ICD-10-CM | POA: Diagnosis not present

## 2020-07-18 DIAGNOSIS — C50912 Malignant neoplasm of unspecified site of left female breast: Secondary | ICD-10-CM

## 2020-07-18 DIAGNOSIS — E7800 Pure hypercholesterolemia, unspecified: Secondary | ICD-10-CM

## 2020-07-18 DIAGNOSIS — Z171 Estrogen receptor negative status [ER-]: Secondary | ICD-10-CM | POA: Diagnosis not present

## 2020-07-18 DIAGNOSIS — I10 Essential (primary) hypertension: Secondary | ICD-10-CM

## 2020-07-18 NOTE — Progress Notes (Signed)
Complete physical exam   Patient: Anna Mcmillan   DOB: 1945/04/20   75 y.o. Female  MRN: [id number] Visit Date: 07/18/2020  Today's healthcare provider: Wilhemena Durie, MD   Chief Complaint  Patient presents with  . Hyperlipidemia   Subjective    Anna Mcmillan is a 75 y.o. female who presents today for a complete physical exam.  She reports consuming a general diet. Patient reports that she is actively exercising she states that she is actively exercising at the [facility] and states that she lost 20lbs in 3 months.  She generally feels well. She reports sleeping well. She does not have additional problems to discuss today.  Patient's been widowed for 3 years.  She has 3 children 4 grandchildren and 3 great-grandchildren. She declines breast exam pelvic exam or rectal exam today. Patient has been completely statin intolerant in the past. Lipid/Cholesterol, Follow-up  Last lipid panel Other pertinent labs  Lab Results  Component Value Date   CHOL 314 (H) 10/13/2019   HDL 84 10/13/2019   LDLCALC 222 (H) 10/13/2019   TRIG 62 10/13/2019   CHOLHDL 3.7 10/13/2019   Lab Results  Component Value Date   ALT 16 10/13/2019   AST 23 10/13/2019   PLT 255 10/13/2019   TSH 1.180 10/13/2019     She was last seen for this 9 months ago.  Management since that visit includes start Zetia 10mg .  She reports poor compliance with treatment. Patient did not pick up prescription from pharmacy She is not having side effects.   Symptoms No chest pain No chest pressure/discomfort  No dyspnea No lower extremity edema  No numbness or tingling of extremity No orthopnea  No palpitations No paroxysmal nocturnal dyspnea  No speech difficulty No syncope   Current diet: in general, a "healthy" diet   Current exercise: walking  The 10-year ASCVD risk score Mikey Bussing DC Jr., et al., 2013) is: 23.3%  Hypertension, follow-up  BP Readings from Last 3 Encounters:  07/18/20 125/80  10/13/19 (!)  159/79  04/05/19 137/79   Wt Readings from Last 3 Encounters:  07/18/20 116 lb (52.6 kg)  10/13/19 128 lb (58.1 kg)  04/05/19 127 lb 6.4 oz (57.8 kg)     She was last seen for hypertension 9 months ago.  BP at that visit was 159/79. Management since that visit includes none.  She reports good compliance with treatment. She is not having side effects.  She is following a Regular diet. She is exercising. She does not smoke.  Use of agents associated with hypertension: none.   Outside blood pressures are systolic 630Z and diastolic 60-10X. Symptoms: No chest pain No chest pressure  No palpitations No syncope  No dyspnea No orthopnea  No paroxysmal nocturnal dyspnea No lower extremity edema   Pertinent labs: Lab Results  Component Value Date   CHOL 314 (H) 10/13/2019   HDL 84 10/13/2019   LDLCALC 222 (H) 10/13/2019   TRIG 62 10/13/2019   CHOLHDL 3.7 10/13/2019   Lab Results  Component Value Date   NA 144 10/13/2019   K 4.3 10/13/2019   CREATININE 0.78 10/13/2019   GFRNONAA 75 10/13/2019   GFRAA 86 10/13/2019   GLUCOSE 107 (H) 10/13/2019     The 10-year ASCVD risk score Mikey Bussing DC Jr., et al., 2013) is: 23.3%   --------------------------------------------------------------------------------------------------- Past Medical History:  Diagnosis Date  . Arthritis   . Cancer [facility]) 2010   left breast   .  Graves disease 1996  . Hypertension 1984  . Pulmonary fibrosis ([place]) 2012  . Thyroid disease    Past Surgical History:  Procedure Laterality Date  . ABDOMINAL HYSTERECTOMY  2001  . BREAST BIOPSY Left 2002  . BREAST SURGERY Left 2002   lumpectomy,   . COLONOSCOPY  2002  . COSMETIC SURGERY Left    implant 1977  . EXTRACORPOREAL SHOCK WAVE LITHOTRIPSY Left 08/13/2017   Procedure: EXTRACORPOREAL SHOCK WAVE LITHOTRIPSY (ESWL);  Surgeon: Hollice Espy, MD;  Location: [facility] ORS;  Service: Urology;  Laterality: Left;   Social History   Socioeconomic History  .  Marital status: Widowed    Spouse name: Not on file  . Number of children: 3  . Years of education: Not on file  . Highest education level: Some college, no degree  Occupational History  . Occupation: retired  Tobacco Use  . Smoking status: Never Smoker  . Smokeless tobacco: Never Used  Vaping Use  . Vaping Use: Never used  Substance and Sexual Activity  . Alcohol use: Yes    Comment: ocassionally wine  . Drug use: No  . Sexual activity: Not on file  Other Topics Concern  . Not on file  Social History Narrative  . Not on file   Social Determinants of Health   Financial Resource Strain: Not on file  Food Insecurity: Not on file  Transportation Needs: Not on file  Physical Activity: Not on file  Stress: Not on file  Social Connections: Not on file  Intimate Partner Violence: Not on file   Family Status  Relation Name Status  . Mother  Alive  . Father  Deceased at age 35       Biological father MVA  . Sister  Deceased  . Brother 1 Deceased       died from a MVA  . Brother 2 Alive  . Brother 3 Alive  . Brother 4 Alive  . Daughter  (Not Specified)   Family History  Problem Relation Age of Onset  . Kidney disease Mother        Kidney failure/ received a kidney transplant when she was 32  . Breast cancer Daughter 56       genetic negative   Allergies  Allergen Reactions  . Sulfa Antibiotics Anaphylaxis and Rash  . Sulfasalazine Anaphylaxis and Rash  . Statins Other (See Comments)    Severe abdominal pain.  . Tamsulosin     Dry cough    Patient Care Team: Jerrol Banana., MD as PCP - General (Family Medicine) Birder Robson, MD as Referring Physician (Ophthalmology)   Medications: Outpatient Medications Prior to Visit  Medication Sig  . amLODipine (NORVASC) 5 MG tablet TAKE 1 TABLET BY MOUTH EVERY DAY  . Ascorbic Acid (VITAMIN C) 1000 MG tablet Take 1,000 mg by mouth daily.  . mometasone (NASONEX) 50 MCG/ACT nasal spray PLACE 2 SPRAYS INTO THE  NOSTRIL ONCE DAILY.  . Multiple Vitamins-Minerals (MULTIVITAMIN GUMMIES WOMENS PO) Take by mouth daily. High in D3  . Zinc 40 MG TABS Take 40 mg by mouth daily.  . Coenzyme Q10 (COQ10) 200 MG CAPS Take by mouth. (Patient not taking: No sig reported)  . metoprolol succinate (TOPROL-XL) 25 MG 24 hr tablet TAKE 1 TABLET BY MOUTH EVERY DAY (Patient not taking: Reported on 07/18/2020)  . [DISCONTINUED] ezetimibe (ZETIA) 10 MG tablet Take 1 tablet (10 mg total) by mouth daily.   No facility-administered medications prior to visit.  Review of Systems  Constitutional: Positive for unexpected weight change.  Eyes: Positive for redness.  All other systems reviewed and are negative.      Objective    BP 125/80   Pulse 93   Resp 16   Ht 5\' 4"  (1.626 m)   Wt 116 lb (52.6 kg)   SpO2 100%   BMI 19.91 kg/m     Physical Exam Vitals reviewed.  Constitutional:      Appearance: She is well-developed.  HENT:     Head: Normocephalic and atraumatic.     Right Ear: External ear normal.     Left Ear: External ear normal.  Eyes:     General: No scleral icterus.    Conjunctiva/sclera: Conjunctivae normal.  Neck:     Thyroid: No thyromegaly.  Cardiovascular:     Rate and Rhythm: Normal rate and regular rhythm.     Heart sounds: Normal heart sounds.  Pulmonary:     Effort: Pulmonary effort is normal.     Breath sounds: Normal breath sounds.  Abdominal:     Palpations: Abdomen is soft.  Skin:    General: Skin is warm and dry.     Comments: Very fair skin.  Neurological:     Mental Status: She is alert and oriented to person, place, and time.  Psychiatric:        Behavior: Behavior normal.        Thought Content: Thought content normal.        Judgment: Judgment normal.       Last depression screening scores PHQ 2/9 Scores 07/18/2020 10/13/2019 05/05/2018  PHQ - 2 Score 0 0 1  PHQ- 9 Score 1 3 -   Last fall risk screening Fall Risk  07/18/2020  Falls in the past year? 0  Comment  -  Number falls in past yr: 0  Comment -  Injury with Fall? 0  Comment -  Follow up -   Last Audit-C alcohol use screening Alcohol Use Disorder Test (AUDIT) 07/18/2020  1. How often do you have a drink containing alcohol? 2  2. How many drinks containing alcohol do you have on a typical day when you are drinking? 0  3. How often do you have six or more drinks on one occasion? 0  AUDIT-C Score 2   A score of 3 or more in women, and 4 or more in men indicates increased risk for alcohol abuse, EXCEPT if all of the points are from question 1   No results found for any visits on 07/18/20.  Assessment & Plan    Routine Health Maintenance and Physical Exam  Exercise Activities and Dietary recommendations Goals    . DIET - INCREASE WATER INTAKE     Recommend increasing water intake to 4-6 glasses of water a day.        Immunization History  Administered Date(s) Administered  . Influenza, High Dose Seasonal PF 11/22/2014, 10/31/2015, 01/20/2017  . Influenza-Unspecified 11/24/2013  . Pneumococcal Conjugate-13 05/24/2014  . Pneumococcal Polysaccharide-23 02/08/2013  . Tdap 09/24/2005    Health Maintenance  Topic Date Due  . COVID-19 Vaccine (1) Never done  . TETANUS/TDAP  09/25/2015  . INFLUENZA VACCINE  09/24/2020  . DEXA SCAN  08/25/2023  . Hepatitis C Screening  Completed  . PNA vac Low Risk Adult  Completed  . HPV VACCINES  Aged Out    Discussed health benefits of physical activity, and encouraged her to engage in regular exercise appropriate for  her age and condition. 1. Encounter for annual wellness visit (AWV) in Medicare patient   2. Essential (primary) hypertension Good control on amlodipine and metoprolol - CBC with Differential/Platelet - TSH  3. Atrophic vaginitis Consider topical HRT, especially topical estrogen  4. Malignant neoplasm of left breast in female, estrogen receptor negative, unspecified site of breast ([place])   5. Hypercholesteremia Patient  is statin intolerant. - Comprehensive metabolic panel - Lipid panel   I, Wilhemena Durie, MD, have reviewed all documentation for this visit. The documentation on 07/23/20 for the exam, diagnosis, procedures, and orders are all accurate and complete.   No follow-ups on file.     I, Wilhemena Durie, MD, have reviewed all documentation for this visit. The documentation on 07/23/20 for the exam, diagnosis, procedures, and orders are all accurate and complete.    Sada Mazzoni Cranford Mon, MD  [facility] [phone] (phone) [phone] (fax)  [place]

## 2020-07-18 NOTE — Patient Instructions (Signed)
Managing Your Hypertension Hypertension, also called high blood pressure, is when the force of the blood pressing against the walls of the arteries is too strong. Arteries are blood vessels that carry blood from your heart throughout your body. Hypertension forces the heart to work harder to pump blood and may cause the arteries to become narrow or stiff. Understanding blood pressure readings Your personal target blood pressure may vary depending on your medical conditions, your age, and other factors. A blood pressure reading includes a higher number over a lower number. Ideally, your blood pressure should be below 120/80. You should know that:  The first, or top, number is called the systolic pressure. It is a measure of the pressure in your arteries as your heart beats.  The second, or bottom number, is called the diastolic pressure. It is a measure of the pressure in your arteries as the heart relaxes. Blood pressure is classified into four stages. Based on your blood pressure reading, your health care provider may use the following stages to determine what type of treatment you need, if any. Systolic pressure and diastolic pressure are measured in a unit called mmHg. Normal  Systolic pressure: below 120.  Diastolic pressure: below 80. Elevated  Systolic pressure: 120-129.  Diastolic pressure: below 80. Hypertension stage 1  Systolic pressure: 130-139.  Diastolic pressure: 80-89. Hypertension stage 2  Systolic pressure: 140 or above.  Diastolic pressure: 90 or above. How can this condition affect me? Managing your hypertension is an important responsibility. Over time, hypertension can damage the arteries and decrease blood flow to important parts of the body, including the brain, heart, and kidneys. Having untreated or uncontrolled hypertension can lead to:  A heart attack.  A stroke.  A weakened blood vessel (aneurysm).  Heart failure.  Kidney damage.  Eye  damage.  Metabolic syndrome.  Memory and concentration problems.  Vascular dementia. What actions can I take to manage this condition? Hypertension can be managed by making lifestyle changes and possibly by taking medicines. Your health care provider will help you make a plan to bring your blood pressure within a normal range. Nutrition  Eat a diet that is high in fiber and potassium, and low in salt (sodium), added sugar, and fat. An example eating plan is called the Dietary Approaches to Stop Hypertension (DASH) diet. To eat this way: ? Eat plenty of fresh fruits and vegetables. Try to fill one-half of your plate at each meal with fruits and vegetables. ? Eat whole grains, such as whole-wheat pasta, brown rice, or whole-grain bread. Fill about one-fourth of your plate with whole grains. ? Eat low-fat dairy products. ? Avoid fatty cuts of meat, processed or cured meats, and poultry with skin. Fill about one-fourth of your plate with lean proteins such as fish, chicken without skin, beans, eggs, and tofu. ? Avoid pre-made and processed foods. These tend to be higher in sodium, added sugar, and fat.  Reduce your daily sodium intake. Most people with hypertension should eat less than 1,500 mg of sodium a day.   Lifestyle  Work with your health care provider to maintain a healthy body weight or to lose weight. Ask what an ideal weight is for you.  Get at least 30 minutes of exercise that causes your heart to beat faster (aerobic exercise) most days of the week. Activities may include walking, swimming, or biking.  Include exercise to strengthen your muscles (resistance exercise), such as weight lifting, as part of your weekly exercise routine. Try   to do these types of exercises for 30 minutes at least 3 days a week.  Do not use any products that contain nicotine or tobacco, such as cigarettes, e-cigarettes, and chewing tobacco. If you need help quitting, ask your health care  provider.  Control any long-term (chronic) conditions you have, such as high cholesterol or diabetes.  Identify your sources of stress and find ways to manage stress. This may include meditation, deep breathing, or making time for fun activities.   Alcohol use  Do not drink alcohol if: ? Your health care provider tells you not to drink. ? You are pregnant, may be pregnant, or are planning to become pregnant.  If you drink alcohol: ? Limit how much you use to:  0-1 drink a day for women.  0-2 drinks a day for men. ? Be aware of how much alcohol is in your drink. In the [place] one drink equals one 12 oz bottle of beer (355 mL), one 5 oz glass of wine (148 mL), or one 1 oz glass of hard liquor (44 mL). Medicines Your health care provider may prescribe medicine if lifestyle changes are not enough to get your blood pressure under control and if:  Your systolic blood pressure is 130 or higher.  Your diastolic blood pressure is 80 or higher. Take medicines only as told by your health care provider. Follow the directions carefully. Blood pressure medicines must be taken as told by your health care provider. The medicine does not work as well when you skip doses. Skipping doses also puts you at risk for problems. Monitoring Before you monitor your blood pressure:  Do not smoke, drink caffeinated beverages, or exercise within 30 minutes before taking a measurement.  Use the bathroom and empty your bladder (urinate).  Sit quietly for at least 5 minutes before taking measurements. Monitor your blood pressure at home as told by your health care provider. To do this:  Sit with your back straight and supported.  Place your feet flat on the floor. Do not cross your legs.  Support your arm on a flat surface, such as a table. Make sure your upper arm is at heart level.  Each time you measure, take two or three readings one minute apart and record the results. You may also need to have your  blood pressure checked regularly by your health care provider.   General information  Talk with your health care provider about your diet, exercise habits, and other lifestyle factors that may be contributing to hypertension.  Review all the medicines you take with your health care provider because there may be side effects or interactions.  Keep all visits as told by your health care provider. Your health care provider can help you create and adjust your plan for managing your high blood pressure. Where to find more information  National Heart, Lung, and Blood Institute: www.nhlbi.nih.gov  American Heart Association: www.heart.org Contact a health care provider if:  You think you are having a reaction to medicines you have taken.  You have repeated (recurrent) headaches.  You feel dizzy.  You have swelling in your ankles.  You have trouble with your vision. Get help right away if:  You develop a severe headache or confusion.  You have unusual weakness or numbness, or you feel faint.  You have severe pain in your chest or abdomen.  You vomit repeatedly.  You have trouble breathing. These symptoms may represent a serious problem that is an emergency. Do not wait   to see if the symptoms will go away. Get medical help right away. Call your local emergency services (911 in the [place] Do not drive yourself to the hospital. Summary  Hypertension is when the force of blood pumping through your arteries is too strong. If this condition is not controlled, it may put you at risk for serious complications.  Your personal target blood pressure may vary depending on your medical conditions, your age, and other factors. For most people, a normal blood pressure is less than 120/80.  Hypertension is managed by lifestyle changes, medicines, or both.  Lifestyle changes to help manage hypertension include losing weight, eating a healthy, low-sodium diet, exercising more, stopping smoking, and  limiting alcohol. This information is not intended to replace advice given to you by your health care provider. Make sure you discuss any questions you have with your health care provider. Document Revised: 03/18/2019 Document Reviewed: 01/11/2019 Elsevier Patient Education  2021 [employer].   Preventing High Cholesterol Cholesterol is a white, waxy substance similar to fat that the human body needs to help build cells. The liver makes all the cholesterol that a person's body needs. Having high cholesterol (hypercholesterolemia) increases your risk for heart disease and stroke. Extra or excess cholesterol comes from the food that you eat. High cholesterol can often be prevented with diet and lifestyle changes. If you already have high cholesterol, you can control it with diet, lifestyle changes, and medicines. How can high cholesterol affect me? If you have high cholesterol, fatty deposits (plaques) may build up on the walls of your blood vessels. The blood vessels that carry blood away from your heart are called arteries. Plaques make the arteries narrower and stiffer. This in turn can:  Restrict or block blood flow and cause blood clots to form.  Increase your risk for heart attack and stroke. What can increase my risk for high cholesterol? This condition is more likely to develop in people who:  Eat foods that are high in saturated fat or cholesterol. Saturated fat is mostly found in foods that come from animal sources.  Are overweight.  Are not getting enough exercise.  Have a family history of high cholesterol (familial hypercholesterolemia). What actions can I take to prevent this? Nutrition  Eat less saturated fat.  Avoid trans fats (partially hydrogenated oils). These are often found in margarine and in some baked goods, fried foods, and snacks bought in packages.  Avoid precooked or cured meat, such as bacon, sausages, or meat loaves.  Avoid foods and drinks that have  added sugars.  Eat more fruits, vegetables, and whole grains.  Choose healthy sources of protein, such as fish, poultry, lean cuts of red meat, beans, peas, lentils, and nuts.  Choose healthy sources of fat, such as: ? Nuts. ? Vegetable oils, especially olive oil. ? Fish that have healthy fats, such as omega-3 fatty acids. These fish include mackerel or salmon.   Lifestyle  Lose weight if you are overweight. Maintaining a healthy body mass index (BMI) can help prevent or control high cholesterol. It can also lower your risk for diabetes and high blood pressure. Ask your health care provider to help you with a diet and exercise plan to lose weight safely.  Do not use any products that contain nicotine or tobacco, such as cigarettes, e-cigarettes, and chewing tobacco. If you need help quitting, ask your health care provider. Alcohol use  Do not drink alcohol if: ? Your health care provider tells you not to drink. ?  You are pregnant, may be pregnant, or are planning to become pregnant.  If you drink alcohol: ? Limit how much you use to:  0-1 drink a day for women.  0-2 drinks a day for men. ? Be aware of how much alcohol is in your drink. In the [place] one drink equals one 12 oz bottle of beer (355 mL), one 5 oz glass of wine (148 mL), or one 1 oz glass of hard liquor (44 mL). Activity  Get enough exercise. Do exercises as told by your health care provider.  Each week, do at least 150 minutes of exercise that takes a medium level of effort (moderate-intensity exercise). This kind of exercise: ? Makes your heart beat faster while allowing you to still be able to talk. ? Can be done in short sessions several times a day or longer sessions a few times a week. For example, on 5 days each week, you could walk fast or ride your bike 3 times a day for 10 minutes each time.   Medicines  Your health care provider may recommend medicines to help lower cholesterol. This may be a medicine to  lower the amount of cholesterol that your liver makes. You may need medicine if: ? Diet and lifestyle changes have not lowered your cholesterol enough. ? You have high cholesterol and other risk factors for heart disease or stroke.  Take over-the-counter and prescription medicines only as told by your health care provider. General information  Manage your risk factors for high cholesterol. Talk with your health care provider about all your risk factors and how to lower your risk.  Manage other conditions that you have, such as diabetes or high blood pressure (hypertension).  Have blood tests to check your cholesterol levels at regular points in time as told by your health care provider.  Keep all follow-up visits as told by your health care provider. This is important. Where to find more information  American Heart Association: www.heart.org  National Heart, Lung, and Blood Institute: https://wilson-eaton.com/ Summary  High cholesterol increases your risk for heart disease and stroke. By keeping your cholesterol level low, you can reduce your risk for these conditions.  High cholesterol can often be prevented with diet and lifestyle changes.  Work with your health care provider to manage your risk factors, and have your blood tested regularly. This information is not intended to replace advice given to you by your health care provider. Make sure you discuss any questions you have with your health care provider. Document Revised: 11/23/2018 Document Reviewed: 11/23/2018 Elsevier Patient Education  [place].

## 2020-07-19 LAB — CBC WITH DIFFERENTIAL/PLATELET
Basophils Absolute: 0 10*3/uL (ref 0.0–0.2)
Basos: 0 %
EOS (ABSOLUTE): 0 10*3/uL (ref 0.0–0.4)
Eos: 1 %
Hematocrit: 45.5 % (ref 34.0–46.6)
Hemoglobin: 15.4 g/dL (ref 11.1–15.9)
Immature Grans (Abs): 0 10*3/uL (ref 0.0–0.1)
Immature Granulocytes: 0 %
Lymphocytes Absolute: 1.7 10*3/uL (ref 0.7–3.1)
Lymphs: 29 %
MCH: 31.1 pg (ref 26.6–33.0)
MCHC: 33.8 g/dL (ref 31.5–35.7)
MCV: 92 fL (ref 79–97)
Monocytes Absolute: 0.5 10*3/uL (ref 0.1–0.9)
Monocytes: 9 %
Neutrophils Absolute: 3.6 10*3/uL (ref 1.4–7.0)
Neutrophils: 61 %
Platelets: 274 10*3/uL (ref 150–450)
RBC: 4.95 x10E6/uL (ref 3.77–5.28)
RDW: 11.9 % (ref 11.7–15.4)
WBC: 5.9 10*3/uL (ref 3.4–10.8)

## 2020-07-19 LAB — COMPREHENSIVE METABOLIC PANEL
ALT: 25 IU/L (ref 0–32)
AST: 31 IU/L (ref 0–40)
Albumin/Globulin Ratio: 1.9 (ref 1.2–2.2)
Albumin: 4.7 g/dL (ref 3.7–4.7)
Alkaline Phosphatase: 109 IU/L (ref 44–121)
BUN/Creatinine Ratio: 16 (ref 12–28)
BUN: 13 mg/dL (ref 8–27)
Bilirubin Total: 0.3 mg/dL (ref 0.0–1.2)
CO2: 25 mmol/L (ref 20–29)
Calcium: 9.7 mg/dL (ref 8.7–10.3)
Chloride: 98 mmol/L (ref 96–106)
Creatinine, Ser: 0.81 mg/dL (ref 0.57–1.00)
Globulin, Total: 2.5 g/dL (ref 1.5–4.5)
Glucose: 91 mg/dL (ref 65–99)
Potassium: 3.9 mmol/L (ref 3.5–5.2)
Sodium: 139 mmol/L (ref 134–144)
Total Protein: 7.2 g/dL (ref 6.0–8.5)
eGFR: 75 mL/min/{1.73_m2} (ref >59)

## 2020-07-19 LAB — LIPID PANEL
Chol/HDL Ratio: 3.3 ratio (ref 0.0–4.4)
Cholesterol, Total: 315 mg/dL — ABNORMAL HIGH (ref 100–199)
HDL: 95 mg/dL (ref >39)
LDL Chol Calc (NIH): 208 mg/dL — ABNORMAL HIGH (ref 0–99)
Triglycerides: 79 mg/dL (ref 0–149)
VLDL Cholesterol Cal: 12 mg/dL (ref 5–40)

## 2020-07-19 LAB — TSH: TSH: 0.848 u[IU]/mL (ref 0.450–4.500)

## 2020-07-29 ENCOUNTER — Other Ambulatory Visit: Payer: Self-pay | Admitting: Family Medicine

## 2020-07-29 DIAGNOSIS — I10 Essential (primary) hypertension: Secondary | ICD-10-CM

## 2020-07-29 NOTE — Telephone Encounter (Signed)
Requested Prescriptions  Pending Prescriptions Disp Refills  . amLODipine (NORVASC) 5 MG tablet [Pharmacy Med Name: AMLODIPINE BESYLATE 5 MG TAB] 90 tablet 1    Sig: TAKE 1 TABLET BY MOUTH EVERY DAY     Cardiovascular:  Calcium Channel Blockers Failed - 07/29/2020 12:52 AM      Failed - Valid encounter within last 6 months    Recent Outpatient Visits          1 week ago Encounter for annual wellness visit (AWV) in Medicare patient   [facility] Jerrol Banana., MD   9 months ago Essential (primary) hypertension   [facility] Jerrol Banana., MD   1 year ago Essential (primary) hypertension   [facility] Jerrol Banana., MD   1 year ago Essential (primary) hypertension   [facility] Jerrol Banana., MD   1 year ago Essential (primary) hypertension   [facility] Jerrol Banana., MD      Future Appointments            In 5 months Jerrol Banana., MD [facility]           Passed - Last BP in normal range    BP Readings from Last 1 Encounters:  07/18/20 125/80

## 2020-09-03 DIAGNOSIS — Z20822 Contact with and (suspected) exposure to covid-19: Secondary | ICD-10-CM | POA: Diagnosis not present

## 2020-09-07 DIAGNOSIS — D485 Neoplasm of uncertain behavior of skin: Secondary | ICD-10-CM | POA: Diagnosis not present

## 2020-09-07 DIAGNOSIS — C44319 Basal cell carcinoma of skin of other parts of face: Secondary | ICD-10-CM | POA: Diagnosis not present

## 2020-09-07 DIAGNOSIS — L821 Other seborrheic keratosis: Secondary | ICD-10-CM | POA: Diagnosis not present

## 2020-09-07 DIAGNOSIS — D2262 Melanocytic nevi of left upper limb, including shoulder: Secondary | ICD-10-CM | POA: Diagnosis not present

## 2020-09-07 DIAGNOSIS — D2261 Melanocytic nevi of right upper limb, including shoulder: Secondary | ICD-10-CM | POA: Diagnosis not present

## 2020-09-07 DIAGNOSIS — D2271 Melanocytic nevi of right lower limb, including hip: Secondary | ICD-10-CM | POA: Diagnosis not present

## 2020-09-07 DIAGNOSIS — Z85828 Personal history of other malignant neoplasm of skin: Secondary | ICD-10-CM | POA: Diagnosis not present

## 2020-10-01 ENCOUNTER — Telehealth: Payer: Self-pay | Admitting: Family Medicine

## 2020-10-01 DIAGNOSIS — R634 Abnormal weight loss: Secondary | ICD-10-CM

## 2020-10-01 DIAGNOSIS — R531 Weakness: Secondary | ICD-10-CM

## 2020-10-01 DIAGNOSIS — L659 Nonscarring hair loss, unspecified: Secondary | ICD-10-CM

## 2020-10-01 NOTE — Telephone Encounter (Signed)
Patient called stating she is having symptoms of Graves disease. She has weight loss, weakness and loss of hair. She is requesting labs for her thyroid. Would like a call back

## 2020-10-01 NOTE — Telephone Encounter (Signed)
Please advise 

## 2020-10-02 NOTE — Telephone Encounter (Signed)
Advised patient as below. Labs ordered.

## 2020-10-03 DIAGNOSIS — R634 Abnormal weight loss: Secondary | ICD-10-CM | POA: Diagnosis not present

## 2020-10-03 DIAGNOSIS — R531 Weakness: Secondary | ICD-10-CM | POA: Diagnosis not present

## 2020-10-03 DIAGNOSIS — L659 Nonscarring hair loss, unspecified: Secondary | ICD-10-CM | POA: Diagnosis not present

## 2020-10-04 ENCOUNTER — Encounter: Payer: Self-pay | Admitting: Family Medicine

## 2020-10-04 LAB — CBC WITH DIFFERENTIAL/PLATELET
Basophils Absolute: 0 10*3/uL (ref 0.0–0.2)
Basos: 0 %
EOS (ABSOLUTE): 0 10*3/uL (ref 0.0–0.4)
Eos: 1 %
Hematocrit: 43.7 % (ref 34.0–46.6)
Hemoglobin: 14.4 g/dL (ref 11.1–15.9)
Immature Grans (Abs): 0 10*3/uL (ref 0.0–0.1)
Immature Granulocytes: 0 %
Lymphocytes Absolute: 1.4 10*3/uL (ref 0.7–3.1)
Lymphs: 21 %
MCH: 30.9 pg (ref 26.6–33.0)
MCHC: 33 g/dL (ref 31.5–35.7)
MCV: 94 fL (ref 79–97)
Monocytes Absolute: 0.4 10*3/uL (ref 0.1–0.9)
Monocytes: 7 %
Neutrophils Absolute: 4.8 10*3/uL (ref 1.4–7.0)
Neutrophils: 71 %
Platelets: 258 10*3/uL (ref 150–450)
RBC: 4.66 x10E6/uL (ref 3.77–5.28)
RDW: 12.7 % (ref 11.7–15.4)
WBC: 6.7 10*3/uL (ref 3.4–10.8)

## 2020-10-04 LAB — COMPREHENSIVE METABOLIC PANEL
ALT: 24 IU/L (ref 0–32)
AST: 28 IU/L (ref 0–40)
Albumin/Globulin Ratio: 2 (ref 1.2–2.2)
Albumin: 4.4 g/dL (ref 3.7–4.7)
Alkaline Phosphatase: 97 IU/L (ref 44–121)
BUN/Creatinine Ratio: 15 (ref 12–28)
BUN: 11 mg/dL (ref 8–27)
Bilirubin Total: 0.4 mg/dL (ref 0.0–1.2)
CO2: 23 mmol/L (ref 20–29)
Calcium: 9.5 mg/dL (ref 8.7–10.3)
Chloride: 103 mmol/L (ref 96–106)
Creatinine, Ser: 0.75 mg/dL (ref 0.57–1.00)
Globulin, Total: 2.2 g/dL (ref 1.5–4.5)
Glucose: 100 mg/dL — ABNORMAL HIGH (ref 65–99)
Potassium: 4.1 mmol/L (ref 3.5–5.2)
Sodium: 143 mmol/L (ref 134–144)
Total Protein: 6.6 g/dL (ref 6.0–8.5)
eGFR: 82 mL/min/{1.73_m2} (ref >59)

## 2020-10-04 LAB — T4, FREE: Free T4: 1.32 ng/dL (ref 0.82–1.77)

## 2020-10-04 LAB — TSH: TSH: 1.27 u[IU]/mL (ref 0.450–4.500)

## 2020-11-06 DIAGNOSIS — L814 Other melanin hyperpigmentation: Secondary | ICD-10-CM | POA: Diagnosis not present

## 2020-11-06 DIAGNOSIS — C44319 Basal cell carcinoma of skin of other parts of face: Secondary | ICD-10-CM | POA: Diagnosis not present

## 2020-11-06 DIAGNOSIS — L578 Other skin changes due to chronic exposure to nonionizing radiation: Secondary | ICD-10-CM | POA: Diagnosis not present

## 2020-11-06 DIAGNOSIS — L988 Other specified disorders of the skin and subcutaneous tissue: Secondary | ICD-10-CM | POA: Diagnosis not present

## 2021-01-03 DIAGNOSIS — Z20822 Contact with and (suspected) exposure to covid-19: Secondary | ICD-10-CM | POA: Diagnosis not present

## 2021-01-22 ENCOUNTER — Encounter: Payer: Self-pay | Admitting: Family Medicine

## 2021-01-22 ENCOUNTER — Ambulatory Visit (INDEPENDENT_AMBULATORY_CARE_PROVIDER_SITE_OTHER): Payer: Medicare Other | Admitting: Family Medicine

## 2021-01-22 ENCOUNTER — Other Ambulatory Visit: Payer: Self-pay

## 2021-01-22 VITALS — BP 135/85 | HR 97 | Temp 98.5°F | Wt 108.8 lb

## 2021-01-22 DIAGNOSIS — R739 Hyperglycemia, unspecified: Secondary | ICD-10-CM

## 2021-01-22 DIAGNOSIS — N63 Unspecified lump in unspecified breast: Secondary | ICD-10-CM | POA: Diagnosis not present

## 2021-01-22 DIAGNOSIS — R634 Abnormal weight loss: Secondary | ICD-10-CM | POA: Diagnosis not present

## 2021-01-22 DIAGNOSIS — C50912 Malignant neoplasm of unspecified site of left female breast: Secondary | ICD-10-CM

## 2021-01-22 DIAGNOSIS — N952 Postmenopausal atrophic vaginitis: Secondary | ICD-10-CM

## 2021-01-22 DIAGNOSIS — Z171 Estrogen receptor negative status [ER-]: Secondary | ICD-10-CM | POA: Diagnosis not present

## 2021-01-22 DIAGNOSIS — I10 Essential (primary) hypertension: Secondary | ICD-10-CM

## 2021-01-22 DIAGNOSIS — E7800 Pure hypercholesterolemia, unspecified: Secondary | ICD-10-CM

## 2021-01-22 DIAGNOSIS — Z780 Asymptomatic menopausal state: Secondary | ICD-10-CM | POA: Diagnosis not present

## 2021-01-22 MED ORDER — ESTROGENS CONJUGATED 0.625 MG/GM VA CREA: TOPICAL_CREAM | VAGINAL | 12 refills | Status: DC

## 2021-01-22 NOTE — Progress Notes (Signed)
Established patient visit   Patient: Anna Mcmillan   DOB: 1945/05/01   75 y.o. Female  MRN: [id number] Visit Date: 01/22/2021  Today's healthcare provider: Wilhemena Durie, MD   Chief Complaint  Patient presents with   Follow-up   Hypertension   Subjective    HPI  Patient comes in today for follow-up.  She had a GI bug 2 weeks ago followed by a URI.  She had negative COVID test at home but she has not had no COVID vaccines at all.  She declines to get any COVID vaccines. She did have a partial hysterectomy 20 years ago. In general she feels very well she has a good appetite but has lost some weight over the past year.  Again she had a recent GI bug and URI. She has recovered from that and feels well. Has been widowed for a couple of years and is now dating a man who was in his mid 66s and they are starting to become more intimate and she is worried that she had some vaginal dryness towards the end of her husband's life to cause some pain for her.  She has no other menopausal symptoms and states at 1 point time she used some Premarin vaginal cream and tolerated this well. Hypertension, follow-up  BP Readings from Last 3 Encounters:  01/22/21 135/85  07/18/20 125/80  10/13/19 (!) 159/79   Wt Readings from Last 3 Encounters:  01/22/21 108 lb 12.8 oz (49.4 kg)  07/18/20 116 lb (52.6 kg)  10/13/19 128 lb (58.1 kg)     She was last seen for hypertension 6 months ago.  BP at that visit was 125/80. Management since that visit includes; Good control on amlodipine and metoprolol. She reports good compliance with treatment. She is not having side effects. none She is exercising. She is adherent to low salt diet.   Outside blood pressures are 110/70-135/85.  She does not smoke.  Use of agents associated with hypertension: none.   ---------------------------------------------------------------------------------------------------     Medications: Outpatient Medications  Prior to Visit  Medication Sig   amLODipine (NORVASC) 5 MG tablet TAKE 1 TABLET BY MOUTH EVERY DAY   Ascorbic Acid (VITAMIN C) 1000 MG tablet Take 1,000 mg by mouth daily.   mometasone (NASONEX) 50 MCG/ACT nasal spray PLACE 2 SPRAYS INTO THE NOSTRIL ONCE DAILY.   Multiple Vitamins-Minerals (MULTIVITAMIN GUMMIES WOMENS PO) Take by mouth daily. High in D3   Zinc 40 MG TABS Take 40 mg by mouth daily.   Coenzyme Q10 (COQ10) 200 MG CAPS Take by mouth. (Patient not taking: Reported on 10/13/2019)   metoprolol succinate (TOPROL-XL) 25 MG 24 hr tablet TAKE 1 TABLET BY MOUTH EVERY DAY (Patient not taking: Reported on 07/18/2020)   No facility-administered medications prior to visit.    Review of Systems  Constitutional:  Negative for appetite change, chills, fatigue and fever.  Respiratory:  Negative for chest tightness and shortness of breath.   Cardiovascular:  Negative for chest pain and palpitations.  Gastrointestinal:  Negative for abdominal pain, nausea and vomiting.  Neurological:  Negative for dizziness and weakness.   Last metabolic panel Lab Results  Component Value Date   GLUCOSE 103 (H) 01/22/2021   NA 138 01/22/2021   K 4.1 01/22/2021   CL 99 01/22/2021   CO2 26 01/22/2021   BUN 11 01/22/2021   CREATININE 0.72 01/22/2021   EGFR 87 01/22/2021   CALCIUM 9.8 01/22/2021   PROT 7.2 01/22/2021  ALBUMIN 4.6 01/22/2021   LABGLOB 2.6 01/22/2021   AGRATIO 1.8 01/22/2021   BILITOT 0.3 01/22/2021   ALKPHOS 114 01/22/2021   AST 27 01/22/2021   ALT 21 01/22/2021   ANIONGAP 11 08/06/2017       Objective    BP 135/85 (BP Location: Right Arm, Patient Position: Sitting, Cuff Size: Normal)   Pulse 97   Temp 98.5 F (36.9 C) (Temporal)   Wt 108 lb 12.8 oz (49.4 kg)   SpO2 98%   BMI 18.68 kg/m  BP Readings from Last 3 Encounters:  01/22/21 135/85  07/18/20 125/80  10/13/19 (!) 159/79   Wt Readings from Last 3 Encounters:  01/22/21 108 lb 12.8 oz (49.4 kg)  07/18/20 116 lb  (52.6 kg)  10/13/19 128 lb (58.1 kg)      Physical Exam Vitals reviewed.  Constitutional:      Appearance: She is well-developed.  HENT:     Head: Normocephalic and atraumatic.     Right Ear: External ear normal.     Left Ear: External ear normal.     Nose: Nose normal.     Mouth/Throat:     Mouth: Mucous membranes are moist.  Eyes:     General: No scleral icterus.    Conjunctiva/sclera: Conjunctivae normal.  Neck:     Thyroid: No thyromegaly.  Cardiovascular:     Rate and Rhythm: Normal rate and regular rhythm.     Heart sounds: Normal heart sounds.  Pulmonary:     Effort: Pulmonary effort is normal.     Breath sounds: Normal breath sounds.  Abdominal:     Palpations: Abdomen is soft.  Musculoskeletal:     Cervical back: Neck supple.  Skin:    General: Skin is warm and dry.  Neurological:     General: No focal deficit present.     Mental Status: She is alert and oriented to person, place, and time.  Psychiatric:        Mood and Affect: Mood normal.        Behavior: Behavior normal.        Thought Content: Thought content normal.        Judgment: Judgment normal.      No results found for any visits on 01/22/21.  Assessment & Plan     1. Essential (primary) hypertension  - CBC w/Diff/Platelet - Comprehensive Metabolic Panel (CMET) - TSH - Sed Rate (ESR) - Hemoglobin A1c  2. Hypercholesteremia  - CBC w/Diff/Platelet - Comprehensive Metabolic Panel (CMET) - TSH - Sed Rate (ESR) - Hemoglobin A1c  3. Unintentional weight loss Patient is willing to have lab work but feels well and otherwise does not want any work-up at this time As long as she feels well I will see her back in 2 to 3 months but if weight loss persists will need CT of abdomen chest and pelvis. - CBC w/Diff/Platelet - Comprehensive Metabolic Panel (CMET) - TSH - Sed Rate (ESR) - Hemoglobin A1c  4. Malignant neoplasm of left breast in female, estrogen receptor negative, unspecified  site of breast (HCC)  - CBC w/Diff/Platelet - Comprehensive Metabolic Panel (CMET) - TSH - Sed Rate (ESR) - Hemoglobin A1c  5. Atrophic vaginitis Try Premarin vaginal cream twice a week.  I think this is extremely low risk even with her breast cancer. - CBC w/Diff/Platelet - Comprehensive Metabolic Panel (CMET) - TSH - Sed Rate (ESR) - Hemoglobin A1c - conjugated estrogens (PREMARIN) vaginal cream; APPLY TWICE A WEEK  Dispense: 42.5 g; Refill: 12  6. Post-menopausal  - CBC w/Diff/Platelet - Comprehensive Metabolic Panel (CMET) - TSH - Sed Rate (ESR) - Hemoglobin A1c - conjugated estrogens (PREMARIN) vaginal cream; APPLY TWICE A WEEK  Dispense: 42.5 g; Refill: 12  7. Hyperglycemia  - CBC w/Diff/Platelet - Comprehensive Metabolic Panel (CMET) - Hemoglobin A1c  8.  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 - MM DIAG BREAST TOMO BILATERAL - MM DIAG BREAST TOMO UNI LEFT - MM DIAG BREAST TOMO UNI RIGHT   Return in about 15 weeks (around 05/07/2021).      I, Wilhemena Durie, MD, have reviewed all documentation for this visit. The documentation on 01/23/21 for the exam, diagnosis, procedures, and orders are all accurate and complete.    Anna Reaves Cranford Mon, MD  [facility] [phone] (phone) [phone] (fax)  [place]

## 2021-01-23 LAB — CBC WITH DIFFERENTIAL/PLATELET
Basophils Absolute: 0 10*3/uL (ref 0.0–0.2)
Basos: 0 %
EOS (ABSOLUTE): 0.1 10*3/uL (ref 0.0–0.4)
Eos: 1 %
Hematocrit: 44.7 % (ref 34.0–46.6)
Hemoglobin: 14.8 g/dL (ref 11.1–15.9)
Immature Grans (Abs): 0 10*3/uL (ref 0.0–0.1)
Immature Granulocytes: 0 %
Lymphocytes Absolute: 1.4 10*3/uL (ref 0.7–3.1)
Lymphs: 19 %
MCH: 30.1 pg (ref 26.6–33.0)
MCHC: 33.1 g/dL (ref 31.5–35.7)
MCV: 91 fL (ref 79–97)
Monocytes Absolute: 0.5 10*3/uL (ref 0.1–0.9)
Monocytes: 6 %
Neutrophils Absolute: 5.4 10*3/uL (ref 1.4–7.0)
Neutrophils: 74 %
Platelets: 258 10*3/uL (ref 150–450)
RBC: 4.91 x10E6/uL (ref 3.77–5.28)
RDW: 12.4 % (ref 11.7–15.4)
WBC: 7.4 10*3/uL (ref 3.4–10.8)

## 2021-01-23 LAB — COMPREHENSIVE METABOLIC PANEL
ALT: 21 IU/L (ref 0–32)
AST: 27 IU/L (ref 0–40)
Albumin/Globulin Ratio: 1.8 (ref 1.2–2.2)
Albumin: 4.6 g/dL (ref 3.7–4.7)
Alkaline Phosphatase: 114 IU/L (ref 44–121)
BUN/Creatinine Ratio: 15 (ref 12–28)
BUN: 11 mg/dL (ref 8–27)
Bilirubin Total: 0.3 mg/dL (ref 0.0–1.2)
CO2: 26 mmol/L (ref 20–29)
Calcium: 9.8 mg/dL (ref 8.7–10.3)
Chloride: 99 mmol/L (ref 96–106)
Creatinine, Ser: 0.72 mg/dL (ref 0.57–1.00)
Globulin, Total: 2.6 g/dL (ref 1.5–4.5)
Glucose: 103 mg/dL — ABNORMAL HIGH (ref 70–99)
Potassium: 4.1 mmol/L (ref 3.5–5.2)
Sodium: 138 mmol/L (ref 134–144)
Total Protein: 7.2 g/dL (ref 6.0–8.5)
eGFR: 87 mL/min/{1.73_m2} (ref >59)

## 2021-01-23 LAB — SEDIMENTATION RATE: Sed Rate: 11 mm/hr (ref 0–40)

## 2021-01-23 LAB — HEMOGLOBIN A1C
Est. average glucose Bld gHb Est-mCnc: 117 mg/dL
Hgb A1c MFr Bld: 5.7 % — ABNORMAL HIGH (ref 4.8–5.6)

## 2021-01-23 LAB — TSH: TSH: 0.901 u[IU]/mL (ref 0.450–4.500)

## 2021-02-20 ENCOUNTER — Other Ambulatory Visit: Payer: Self-pay | Admitting: Family Medicine

## 2021-02-20 DIAGNOSIS — N644 Mastodynia: Secondary | ICD-10-CM | POA: Diagnosis not present

## 2021-02-20 DIAGNOSIS — I10 Essential (primary) hypertension: Secondary | ICD-10-CM

## 2021-03-14 DIAGNOSIS — L821 Other seborrheic keratosis: Secondary | ICD-10-CM | POA: Diagnosis not present

## 2021-03-14 DIAGNOSIS — D2271 Melanocytic nevi of right lower limb, including hip: Secondary | ICD-10-CM | POA: Diagnosis not present

## 2021-03-14 DIAGNOSIS — L814 Other melanin hyperpigmentation: Secondary | ICD-10-CM | POA: Diagnosis not present

## 2021-03-14 DIAGNOSIS — D2272 Melanocytic nevi of left lower limb, including hip: Secondary | ICD-10-CM | POA: Diagnosis not present

## 2021-03-14 DIAGNOSIS — L249 Irritant contact dermatitis, unspecified cause: Secondary | ICD-10-CM | POA: Diagnosis not present

## 2021-05-06 NOTE — Progress Notes (Unsigned)
°  ° ° °  Established patient visit   Patient: Anna Mcmillan   DOB: 12/19/1945   76 y.o. Female  MRN: [id number] Visit Date: 05/07/2021  Today's healthcare provider: Wilhemena Durie, MD   No chief complaint on file.  Subjective    HPI  ***  Medications: Outpatient Medications Prior to Visit  Medication Sig   amLODipine (NORVASC) 5 MG tablet TAKE 1 TABLET BY MOUTH EVERY DAY   Ascorbic Acid (VITAMIN C) 1000 MG tablet Take 1,000 mg by mouth daily.   Coenzyme Q10 (COQ10) 200 MG CAPS Take by mouth. (Patient not taking: Reported on 10/13/2019)   conjugated estrogens (PREMARIN) vaginal cream APPLY TWICE A WEEK   metoprolol succinate (TOPROL-XL) 25 MG 24 hr tablet TAKE 1 TABLET BY MOUTH EVERY DAY (Patient not taking: Reported on 07/18/2020)   mometasone (NASONEX) 50 MCG/ACT nasal spray PLACE 2 SPRAYS INTO THE NOSTRIL ONCE DAILY.   Multiple Vitamins-Minerals (MULTIVITAMIN GUMMIES WOMENS PO) Take by mouth daily. High in D3   Zinc 40 MG TABS Take 40 mg by mouth daily.   No facility-administered medications prior to visit.    Review of Systems  Constitutional:  Negative for appetite change, chills, fatigue and fever.  Respiratory:  Negative for chest tightness and shortness of breath.   Cardiovascular:  Negative for chest pain and palpitations.  Gastrointestinal:  Negative for abdominal pain, nausea and vomiting.  Neurological:  Negative for dizziness and weakness.   {Labs   Heme   Chem   Endocrine   Serology   Results Review (optional):23779}   Objective    There were no vitals taken for this visit. {Show previous vital signs (optional):23777}  Physical Exam  ***  No results found for any visits on 05/07/21.  Assessment & Plan     ***  No follow-ups on file.      {provider attestation***:1}   Wilhemena Durie, MD  [facility] [phone] (phone) [phone] (fax)  [place]

## 2021-05-07 ENCOUNTER — Ambulatory Visit (INDEPENDENT_AMBULATORY_CARE_PROVIDER_SITE_OTHER): Payer: Medicare Other | Admitting: Family Medicine

## 2021-05-07 ENCOUNTER — Other Ambulatory Visit: Payer: Self-pay

## 2021-05-07 ENCOUNTER — Encounter: Payer: Self-pay | Admitting: Family Medicine

## 2021-05-07 VITALS — BP 131/87 | HR 94 | Temp 98.2°F | Resp 16 | Ht 64.0 in | Wt 114.0 lb

## 2021-05-07 DIAGNOSIS — M810 Age-related osteoporosis without current pathological fracture: Secondary | ICD-10-CM

## 2021-05-07 DIAGNOSIS — C50912 Malignant neoplasm of unspecified site of left female breast: Secondary | ICD-10-CM | POA: Diagnosis not present

## 2021-05-07 DIAGNOSIS — E7800 Pure hypercholesterolemia, unspecified: Secondary | ICD-10-CM

## 2021-05-07 DIAGNOSIS — R634 Abnormal weight loss: Secondary | ICD-10-CM | POA: Diagnosis not present

## 2021-05-07 DIAGNOSIS — Z171 Estrogen receptor negative status [ER-]: Secondary | ICD-10-CM | POA: Diagnosis not present

## 2021-05-07 DIAGNOSIS — N952 Postmenopausal atrophic vaginitis: Secondary | ICD-10-CM | POA: Diagnosis not present

## 2021-05-07 DIAGNOSIS — I10 Essential (primary) hypertension: Secondary | ICD-10-CM

## 2021-05-11 DIAGNOSIS — Z20822 Contact with and (suspected) exposure to covid-19: Secondary | ICD-10-CM | POA: Diagnosis not present

## 2021-05-23 DIAGNOSIS — Z20828 Contact with and (suspected) exposure to other viral communicable diseases: Secondary | ICD-10-CM | POA: Diagnosis not present

## 2021-05-24 DIAGNOSIS — Z20822 Contact with and (suspected) exposure to covid-19: Secondary | ICD-10-CM | POA: Diagnosis not present

## 2021-05-26 DIAGNOSIS — Z20822 Contact with and (suspected) exposure to covid-19: Secondary | ICD-10-CM | POA: Diagnosis not present

## 2021-05-31 ENCOUNTER — Telehealth: Payer: Self-pay | Admitting: *Deleted

## 2021-05-31 NOTE — Chronic Care Management (AMB) (Signed)
?  Care Management  ? ?Outreach Note ? ?05/31/2021 ?Name: Anna Mcmillan MRN: [id number] DOB: [birth year] ? ?Referred by: Jerrol Banana., MD ?Reason for referral : Care Coordination (Outreach to schedule with [facility] ) ? ? ?An unsuccessful telephone outreach was attempted today. The patient was referred to the case management team for assistance with care management and care coordination.  ? ?Follow Up Plan:  ?A HIPAA compliant phone message was left for the patient providing contact information and requesting a return call.  ?The care management team will reach out to the patient again over the next 10 days.  ?If patient returns call to provider office, please advise to call [place] * at [phone].* ? ?Laverda Sorenson  ?Care Guide, Embedded Care Coordination ?[place]  Care Management  ?Direct Dial: [phone] ? ?

## 2021-06-10 NOTE — Chronic Care Management (AMB) (Signed)
?  Care Management  ? ?Outreach Note ? ?06/10/2021 ?Name: STEPHAIE Mcmillan MRN: [id number] DOB: Sep 16, 1945 ? ?Reason for referral : Care Coordination (Outreach to schedule with RNCM ) ? ? ?A second unsuccessful telephone outreach was attempted today. The patient was referred to the case management team for assistance with care management and care coordination.  ? ?Follow Up Plan:  ?A HIPAA compliant phone message was left for the patient providing contact information and requesting a return call.  ?The care management team will reach out to the patient again over the next 7 days.  ?If patient returns call to provider office, please advise to call [place]* at [phone].* ? ?Laverda Sorenson  ?Care Guide, Embedded Care Coordination ?[place]  Care Management  ?Direct Dial: [phone] ? ?

## 2021-06-17 NOTE — Chronic Care Management (AMB) (Signed)
?  Care Management  ? ?Outreach Note ? ?06/17/2021 ?Name: Anna Mcmillan MRN: [id number] DOB: 10-23-1945 ? ?Referred by: Jerrol Banana., MD ?Reason for referral : Care Coordination (Outreach to schedule with [facility] ) ? ? ?Third unsuccessful telephone outreach was attempted today. The patient was referred to the case management team for assistance with care management and care coordination. TThe care management team is pleased to engage with this patient at any time in the future should he/she be interested in assistance from the care management team.  ? ?Follow Up Plan:  ?We have been unable to make contact with the patient. The care management team is available to follow up with the patient after provider conversation with the patient regarding recommendation for care management engagement and subsequent re-referral to the care management team.  ?A HIPAA compliant phone message was left for the patient providing contact information and requesting a return call.  ? ?Laverda Sorenson  ?Care Guide, Embedded Care Coordination ?[place]  Care Management  ?Direct Dial: [phone]  ?

## 2021-06-22 DIAGNOSIS — Z20822 Contact with and (suspected) exposure to covid-19: Secondary | ICD-10-CM | POA: Diagnosis not present

## 2021-06-28 DIAGNOSIS — Z20822 Contact with and (suspected) exposure to covid-19: Secondary | ICD-10-CM | POA: Diagnosis not present

## 2021-06-30 DIAGNOSIS — Z20822 Contact with and (suspected) exposure to covid-19: Secondary | ICD-10-CM | POA: Diagnosis not present

## 2021-08-13 NOTE — Progress Notes (Unsigned)
Annual Wellness Visit  I,Egan Berkheimer,acting as a scribe for Wilhemena Durie, MD.,have documented all relevant documentation on the behalf of Wilhemena Durie, MD,as directed by  Wilhemena Durie, MD while in the presence of Wilhemena Durie, MD.     Patient: Anna Mcmillan, Female    DOB: 11-02-1945, 76 y.o.   MRN: [id number] Visit Date: 08/14/2021  Today's Provider: Wilhemena Durie, MD   Chief Complaint  Patient presents with   Medicare Wellness   Subjective    Anna Mcmillan is a 76 y.o. female who presents today for her Annual Wellness Visit. She reports consuming a general diet. Gym/ health club routine includes [employer]. She generally feels well. She reports sleeping well. She does not have additional problems to discuss today.   HPI    Medications: Outpatient Medications Prior to Visit  Medication Sig   amLODipine (NORVASC) 5 MG tablet TAKE 1 TABLET BY MOUTH EVERY DAY   Ascorbic Acid (VITAMIN C) 1000 MG tablet Take 1,000 mg by mouth daily.   conjugated estrogens (PREMARIN) vaginal cream APPLY TWICE A WEEK   mometasone (NASONEX) 50 MCG/ACT nasal spray PLACE 2 SPRAYS INTO THE NOSTRIL ONCE DAILY.   Multiple Vitamins-Minerals (MULTIVITAMIN GUMMIES WOMENS PO) Take by mouth daily. High in D3   Zinc 40 MG TABS Take 40 mg by mouth daily.   No facility-administered medications prior to visit.    Allergies  Allergen Reactions   Sulfa Antibiotics Anaphylaxis and Rash   Sulfasalazine Anaphylaxis and Rash   Statins Other (See Comments)    Severe abdominal pain.   Tamsulosin     Dry cough    Patient Care Team: Jerrol Banana., MD as PCP - General (Family Medicine) Birder Robson, MD as Referring Physician (Ophthalmology)  Review of Systems  Musculoskeletal:  Positive for back pain.  Skin:  Positive for rash.  All other systems reviewed and are negative.   Last thyroid functions Lab Results  Component Value Date   TSH 0.901 01/22/2021         Objective    Vitals: BP 128/75 (BP Location: Right Arm, Patient Position: Sitting, Cuff Size: Normal)   Pulse 78   Resp 16   Ht '5\' 4"'$  (1.626 m)   Wt 113 lb (51.3 kg)   SpO2 100%   BMI 19.40 kg/m  BP Readings from Last 3 Encounters:  08/14/21 128/75  05/07/21 131/87  01/22/21 135/85   Wt Readings from Last 3 Encounters:  08/14/21 113 lb (51.3 kg)  05/07/21 114 lb (51.7 kg)  01/22/21 108 lb 12.8 oz (49.4 kg)       Physical Exam ***  Most recent functional status assessment:    08/14/2021   10:00 AM  In your present state of health, do you have any difficulty performing the following activities:  Hearing? 0  Vision? 0  Difficulty concentrating or making decisions? 1  Walking or climbing stairs? 0  Dressing or bathing? 0  Doing errands, shopping? 0   Most recent fall risk assessment:    08/14/2021   10:00 AM  [place] in the past year? 0  Number falls in past yr: 0  Injury with Fall? 0  Risk for fall due to : No Fall Risks  Follow up Falls evaluation completed    Most recent depression screenings:    08/14/2021   10:00 AM 05/07/2021    9:04 AM  PHQ 2/9 Scores  PHQ -  2 Score 1 1  PHQ- 9 Score 1 3   Most recent cognitive screening:     No data to display         Most recent Audit-C alcohol use screening    08/14/2021    9:59 AM  Alcohol Use Disorder Test (AUDIT)  1. How often do you have a drink containing alcohol? 3  2. How many drinks containing alcohol do you have on a typical day when you are drinking? 0  3. How often do you have six or more drinks on one occasion? 0  AUDIT-C Score 3  4. How often during the last year have you found that you were not able to stop drinking once you had started? 0  5. How often during the last year have you failed to do what was normally expected from you because of drinking? 0  6. How often during the last year have you needed a first drink in the morning to get yourself going after a heavy drinking  session? 0  7. How often during the last year have you had a feeling of guilt of remorse after drinking? 0  8. How often during the last year have you been unable to remember what happened the night before because you had been drinking? 0  9. Have you or someone else been injured as a result of your drinking? 0  10. Has a relative or friend or a doctor or another health worker been concerned about your drinking or suggested you cut down? 0  Alcohol Use Disorder Identification Test Final Score (AUDIT) 3   A score of 3 or more in women, and 4 or more in men indicates increased risk for alcohol abuse, EXCEPT if all of the points are from question 1   No results found for any visits on 08/14/21.  Assessment & Plan     Annual wellness visit done today including the all of the following: Reviewed patient's Family Medical History Reviewed and updated list of patient's medical providers Assessment of cognitive impairment was done Assessed patient's functional ability Established a written schedule for health screening [place] Completed and Reviewed  Exercise Activities and Dietary recommendations  Goals      DIET - INCREASE WATER INTAKE     Recommend increasing water intake to 4-6 glasses of water a day.         Immunization History  Administered Date(s) Administered   Influenza, High Dose Seasonal PF 11/22/2014, 10/31/2015, 01/20/2017   Influenza-Unspecified 11/24/2013   Pneumococcal Conjugate-13 05/24/2014   Pneumococcal Polysaccharide-23 02/08/2013   Tdap 09/24/2005    Health Maintenance  Topic Date Due   COVID-19 Vaccine (1) Never done   Zoster Vaccines- Shingrix (1 of 2) Never done   TETANUS/TDAP  09/25/2015   INFLUENZA VACCINE  09/24/2021   DEXA SCAN  08/25/2023   Pneumonia Vaccine 18+ Years old  Completed   Hepatitis C Screening  Completed   HPV VACCINES  Aged Out   COLONOSCOPY (Pts 45-61yr Insurance coverage will need to be confirmed)   Discontinued     Discussed health benefits of physical activity, and encouraged her to engage in regular exercise appropriate for her age and condition.    1. Encounter for annual wellness visit (AWV) in Medicare patient   2. Essential (primary) hypertension  - Lipid panel - TSH - CBC w/Diff/Platelet - Comprehensive Metabolic Panel (CMET) - Hemoglobin A1c  3. Hypercholesteremia  - Lipid panel - TSH - CBC w/Diff/Platelet -  Comprehensive Metabolic Panel (CMET) - Hemoglobin A1c  4. Hyperglycemia  - Lipid panel - TSH - CBC w/Diff/Platelet - Comprehensive Metabolic Panel (CMET) - Hemoglobin A1c  5. Age-related osteoporosis without current pathological fracture  - Lipid panel - TSH - CBC w/Diff/Platelet - Comprehensive Metabolic Panel (CMET) - Hemoglobin A1c  6. Allergic rhinitis, unspecified seasonality, unspecified trigger  - Lipid panel - TSH - CBC w/Diff/Platelet - Comprehensive Metabolic Panel (CMET) - Hemoglobin A1c   Return in about 6 months (around 02/13/2022).     {provider attestation***:1}   Wilhemena Durie, MD  [facility] [phone] (phone) [phone] (fax)  [place]

## 2021-08-14 ENCOUNTER — Encounter: Payer: Self-pay | Admitting: Family Medicine

## 2021-08-14 ENCOUNTER — Ambulatory Visit (INDEPENDENT_AMBULATORY_CARE_PROVIDER_SITE_OTHER): Payer: Medicare Other | Admitting: Family Medicine

## 2021-08-14 VITALS — BP 128/75 | HR 78 | Resp 16 | Ht 64.0 in | Wt 113.0 lb

## 2021-08-14 DIAGNOSIS — R634 Abnormal weight loss: Secondary | ICD-10-CM

## 2021-08-14 DIAGNOSIS — M545 Low back pain, unspecified: Secondary | ICD-10-CM

## 2021-08-14 DIAGNOSIS — N952 Postmenopausal atrophic vaginitis: Secondary | ICD-10-CM | POA: Diagnosis not present

## 2021-08-14 DIAGNOSIS — R739 Hyperglycemia, unspecified: Secondary | ICD-10-CM | POA: Diagnosis not present

## 2021-08-14 DIAGNOSIS — I10 Essential (primary) hypertension: Secondary | ICD-10-CM | POA: Diagnosis not present

## 2021-08-14 DIAGNOSIS — J309 Allergic rhinitis, unspecified: Secondary | ICD-10-CM | POA: Diagnosis not present

## 2021-08-14 DIAGNOSIS — C50912 Malignant neoplasm of unspecified site of left female breast: Secondary | ICD-10-CM

## 2021-08-14 DIAGNOSIS — Z171 Estrogen receptor negative status [ER-]: Secondary | ICD-10-CM

## 2021-08-14 DIAGNOSIS — M810 Age-related osteoporosis without current pathological fracture: Secondary | ICD-10-CM | POA: Diagnosis not present

## 2021-08-14 DIAGNOSIS — Z853 Personal history of malignant neoplasm of breast: Secondary | ICD-10-CM | POA: Diagnosis not present

## 2021-08-14 DIAGNOSIS — E7800 Pure hypercholesterolemia, unspecified: Secondary | ICD-10-CM | POA: Diagnosis not present

## 2021-08-14 DIAGNOSIS — L309 Dermatitis, unspecified: Secondary | ICD-10-CM | POA: Diagnosis not present

## 2021-08-14 DIAGNOSIS — Z0000 Encounter for general adult medical examination without abnormal findings: Secondary | ICD-10-CM

## 2021-08-15 LAB — CBC WITH DIFFERENTIAL/PLATELET
Basophils Absolute: 0 10*3/uL (ref 0.0–0.2)
Basos: 1 %
EOS (ABSOLUTE): 0.1 10*3/uL (ref 0.0–0.4)
Eos: 1 %
Hematocrit: 44.6 % (ref 34.0–46.6)
Hemoglobin: 15.2 g/dL (ref 11.1–15.9)
Immature Grans (Abs): 0 10*3/uL (ref 0.0–0.1)
Immature Granulocytes: 0 %
Lymphocytes Absolute: 1.7 10*3/uL (ref 0.7–3.1)
Lymphs: 26 %
MCH: 31.4 pg (ref 26.6–33.0)
MCHC: 34.1 g/dL (ref 31.5–35.7)
MCV: 92 fL (ref 79–97)
Monocytes Absolute: 0.5 10*3/uL (ref 0.1–0.9)
Monocytes: 8 %
Neutrophils Absolute: 4 10*3/uL (ref 1.4–7.0)
Neutrophils: 64 %
Platelets: 266 10*3/uL (ref 150–450)
RBC: 4.84 x10E6/uL (ref 3.77–5.28)
RDW: 12.4 % (ref 11.7–15.4)
WBC: 6.3 10*3/uL (ref 3.4–10.8)

## 2021-08-15 LAB — LIPID PANEL
Chol/HDL Ratio: 3.5 ratio (ref 0.0–4.4)
Cholesterol, Total: 303 mg/dL — ABNORMAL HIGH (ref 100–199)
HDL: 86 mg/dL (ref >39)
LDL Chol Calc (NIH): 202 mg/dL — ABNORMAL HIGH (ref 0–99)
Triglycerides: 94 mg/dL (ref 0–149)
VLDL Cholesterol Cal: 15 mg/dL (ref 5–40)

## 2021-08-15 LAB — COMPREHENSIVE METABOLIC PANEL
ALT: 20 IU/L (ref 0–32)
AST: 26 IU/L (ref 0–40)
Albumin/Globulin Ratio: 1.6 (ref 1.2–2.2)
Albumin: 4.5 g/dL (ref 3.7–4.7)
Alkaline Phosphatase: 94 IU/L (ref 44–121)
BUN/Creatinine Ratio: 14 (ref 12–28)
BUN: 11 mg/dL (ref 8–27)
Bilirubin Total: 0.5 mg/dL (ref 0.0–1.2)
CO2: 25 mmol/L (ref 20–29)
Calcium: 9.7 mg/dL (ref 8.7–10.3)
Chloride: 99 mmol/L (ref 96–106)
Creatinine, Ser: 0.81 mg/dL (ref 0.57–1.00)
Globulin, Total: 2.9 g/dL (ref 1.5–4.5)
Glucose: 104 mg/dL — ABNORMAL HIGH (ref 70–99)
Potassium: 4 mmol/L (ref 3.5–5.2)
Sodium: 140 mmol/L (ref 134–144)
Total Protein: 7.4 g/dL (ref 6.0–8.5)
eGFR: 75 mL/min/{1.73_m2} (ref >59)

## 2021-08-15 LAB — HEMOGLOBIN A1C
Est. average glucose Bld gHb Est-mCnc: 114 mg/dL
Hgb A1c MFr Bld: 5.6 % (ref 4.8–5.6)

## 2021-08-15 LAB — TSH: TSH: 0.989 u[IU]/mL (ref 0.450–4.500)

## 2021-08-21 ENCOUNTER — Telehealth: Payer: Self-pay

## 2021-08-21 MED ORDER — EZETIMIBE 10 MG PO TABS: 10.0000 mg | ORAL_TABLET | Freq: Every day | ORAL | 1 refills | Status: DC

## 2021-08-21 NOTE — Telephone Encounter (Signed)
Patient advised of lab result. She agreed to starting medication. Prescription sent to pharmacy.

## 2021-08-21 NOTE — Telephone Encounter (Signed)
-----   Message from Jerrol Banana., MD sent at 08/19/2021 12:51 PM EDT ----- Labs stable.  Patient has statin intolerance with muscle aches but would recommend Zetia 10 mg every morning which is a nonstatin.  We will follow-up in 3 to 4 months on this. please advise patient.

## 2021-10-10 ENCOUNTER — Other Ambulatory Visit: Payer: Self-pay | Admitting: Family Medicine

## 2021-10-10 DIAGNOSIS — I10 Essential (primary) hypertension: Secondary | ICD-10-CM

## 2021-12-16 ENCOUNTER — Ambulatory Visit: Payer: Medicare Other | Admitting: Family Medicine

## 2021-12-27 NOTE — Progress Notes (Unsigned)
I,Anna Mcmillan,acting as a scribe for [employer], MD.,have documented all relevant documentation on the behalf of Anna Foster, MD,as directed by  Anna Foster, MD while in the presence of Anna Foster, MD.   Established patient visit   Patient: Anna Mcmillan   DOB: 1945/11/29   76 y.o. Female  MRN: [id number] Visit Date: 01/01/2022  Today's healthcare provider: [name], MD   Chief Complaint  Patient presents with   Follow-up   Hypertension   Hyperlipidemia   Subjective    HPI   Vertigo Patient reports that since testing positive for COVID, she noticed that she has more issues with vertigo She reports that her symptoms seem to occur after lying down and then sitting upright She reports symptoms occur multiple times throughout the week The symptoms sometimes last half the day or resolve after a few moments She states that she is concerned about balance on the days where her dizziness is prolonged Dizziness is described as "room spinning" and she sometimes has to sit down for the symptoms to stop    Hypertension, follow-up  BP Readings from Last 3 Encounters:  01/01/22 136/87  08/14/21 128/75  05/07/21 131/87   Wt Readings from Last 3 Encounters:  01/01/22 111 lb 9.6 oz (50.6 kg)  08/14/21 113 lb (51.3 kg)  05/07/21 114 lb (51.7 kg)     She was last seen for hypertension 3-4 months ago.  BP at that visit was 128/75. Management since that visit includes; Continue Amlodipine 5 mg.  Outside blood pressures are normal.  Pertinent labs Lab Results  Component Value Date   CHOL 303 (H) 08/14/2021   HDL 86 08/14/2021   LDLCALC 202 (H) 08/14/2021   TRIG 94 08/14/2021   CHOLHDL 3.5 08/14/2021   Lab Results  Component Value Date   NA 140 08/14/2021   K 4.0 08/14/2021   CREATININE 0.81 08/14/2021   EGFR 75 08/14/2021   GLUCOSE 104 (H) 08/14/2021   TSH 0.989 08/14/2021     The 10-year ASCVD risk  score (Arnett DK, et al., 2019) is: 29.7%  ---------------------------------------------------------------------------------------------------  Lipid/Cholesterol, Follow-up  Last lipid panel Other pertinent labs  Lab Results  Component Value Date   CHOL 303 (H) 08/14/2021   HDL 86 08/14/2021   LDLCALC 202 (H) 08/14/2021   TRIG 94 08/14/2021   CHOLHDL 3.5 08/14/2021   Lab Results  Component Value Date   ALT 20 08/14/2021   AST 26 08/14/2021   PLT 266 08/14/2021   TSH 0.989 08/14/2021     She was last seen for this 3-4 months ago.  Management since that visit includes;  Zetia 10 mg every morning which is a nonstatin .   The 10-year ASCVD risk score (Arnett DK, et al., 2019) is: 29.7%  ---------------------------------------------------------------------------------------------------   Patient only took Zetia for 1 week due to side effects. Patient states she was having extreme fatigue and joint pain.  Medications: Outpatient Medications Prior to Visit  Medication Sig   amLODipine (NORVASC) 5 MG tablet TAKE 1 TABLET BY MOUTH EVERY DAY   Ascorbic Acid (VITAMIN C) 1000 MG tablet Take 1,000 mg by mouth daily.   mometasone (NASONEX) 50 MCG/ACT nasal spray PLACE 2 SPRAYS INTO THE NOSTRIL ONCE DAILY.   Multiple Vitamins-Minerals (MULTIVITAMIN GUMMIES WOMENS PO) Take by mouth daily. High in D3   Zinc 40 MG TABS Take 40 mg by mouth daily.   [DISCONTINUED] conjugated estrogens (PREMARIN) vaginal cream APPLY TWICE A WEEK (Patient not  taking: Reported on 01/01/2022)   [DISCONTINUED] ezetimibe (ZETIA) 10 MG tablet Take 1 tablet (10 mg total) by mouth daily. (Patient not taking: Reported on 01/01/2022)   No facility-administered medications prior to visit.    Review of Systems  Constitutional:  Negative for appetite change, chills, fatigue and fever.  Respiratory:  Negative for chest tightness and shortness of breath.   Cardiovascular:  Negative for chest pain and palpitations.   Gastrointestinal:  Negative for abdominal pain, nausea and vomiting.  Neurological:  Negative for dizziness and weakness.       Objective    BP 136/87 (BP Location: Left Arm, Patient Position: Sitting, Cuff Size: Normal)   Pulse 94   Resp 16   Wt 111 lb 9.6 oz (50.6 kg)   SpO2 98%   BMI 19.16 kg/m    Physical Exam Vitals reviewed.  Constitutional:      General: She is not in acute distress.    Appearance: Normal appearance. She is not ill-appearing, toxic-appearing or diaphoretic.  Eyes:     Conjunctiva/sclera: Conjunctivae normal.  Cardiovascular:     Rate and Rhythm: Normal rate and regular rhythm.     Pulses: Normal pulses.     Heart sounds: Normal heart sounds. No murmur heard.    No friction rub. No gallop.  Pulmonary:     Effort: Pulmonary effort is normal. No respiratory distress.     Breath sounds: Normal breath sounds. No stridor. No wheezing, rhonchi or rales.  Abdominal:     General: Bowel sounds are normal. There is no distension.     Palpations: Abdomen is soft.     Tenderness: There is no abdominal tenderness.  Musculoskeletal:     Right lower leg: No edema.     Left lower leg: No edema.  Skin:    Findings: No erythema or rash.  Neurological:     Mental Status: She is alert and oriented to person, place, and time.     No results found for any visits on 01/01/22.   Assessment & Plan     Problem List Items Addressed This Visit       Cardiovascular and Mediastinum   Essential (primary) hypertension    Controlled No current medications  CMP collected today  Follow-up 6 months         Nervous and Auditory   BPPV (benign paroxysmal positional vertigo), bilateral    + [place] pike during exam  Patient given handout on Epley maneuver in office to try at home  She will follow up in 1 month  No other neurologic deficits during exam         Other   Hypercholesteremia - Primary    Patient does not wish to start statin therapy and reports  intolerance of Zetia including joint pain and fatigue  ASCvD reviewed with the patient and discussed recommendation for cholesterol lowering medication vs lifestyle modification for another three months given that patient >89 yo  Reviewed options including repatha or cardiology referral as well  She would like to try closer monitoring of her diet for an additional three months and recheck lipids at that time  She will follow up in three months  No lipid panel today given the short amount of time that she was on zetia         Return in about 1 month (around 01/31/2022) for BPPV.      I, Anna Foster, MD, have reviewed all documentation for this visit. The documentation on 01/01/22  for the exam, diagnosis, procedures, and orders are all accurate and complete.  Portions of this information were initially documented by the CMA and reviewed by me for thoroughness and accuracy.    Anna Foster, MD  [facility] [phone] (phone) [phone] (fax)  [place]

## 2022-01-01 ENCOUNTER — Ambulatory Visit (INDEPENDENT_AMBULATORY_CARE_PROVIDER_SITE_OTHER): Payer: Medicare Other | Admitting: Family Medicine

## 2022-01-01 ENCOUNTER — Encounter: Payer: Self-pay | Admitting: Family Medicine

## 2022-01-01 VITALS — BP 136/87 | HR 94 | Resp 16 | Wt 111.6 lb

## 2022-01-01 DIAGNOSIS — I10 Essential (primary) hypertension: Secondary | ICD-10-CM

## 2022-01-01 DIAGNOSIS — E7800 Pure hypercholesterolemia, unspecified: Secondary | ICD-10-CM | POA: Diagnosis not present

## 2022-01-01 DIAGNOSIS — H8113 Benign paroxysmal vertigo, bilateral: Secondary | ICD-10-CM | POA: Diagnosis not present

## 2022-01-01 HISTORY — DX: Benign paroxysmal vertigo, bilateral: H81.13

## 2022-01-01 NOTE — Assessment & Plan Note (Addendum)
Patient does not wish to start statin therapy and reports intolerance of Zetia including joint pain and fatigue  ASCvD reviewed with the patient and discussed recommendation for cholesterol lowering medication vs lifestyle modification for another three months given that patient >76 yo  Reviewed options including repatha or cardiology referral as well  She would like to try closer monitoring of her diet for an additional three months and recheck lipids at that time  She will follow up in three months  No lipid panel today given the short amount of time that she was on zetia

## 2022-01-01 NOTE — Assessment & Plan Note (Signed)
Controlled No current medications  CMP collected today  Follow-up 6 months

## 2022-01-01 NOTE — Assessment & Plan Note (Signed)
+   Dix[place] pike during exam  Patient given handout on Epley maneuver in office to try at home  She will follow up in 1 month  No other neurologic deficits during exam

## 2022-01-01 NOTE — Patient Instructions (Signed)
How to Perform the Epley Maneuver The Epley maneuver is an exercise that relieves symptoms of vertigo. Vertigo is the feeling that you or your surroundings are moving when they are not. When you feel vertigo, you may feel like the room is spinning and may have trouble walking. The Epley maneuver is used for a type of vertigo caused by a calcium deposit in a part of the inner ear. The maneuver involves changing head positions to help the deposit move out of the area. You can do this maneuver at home whenever you have symptoms of vertigo. You can repeat it in 24 hours if your vertigo has not gone away. Even though the Epley maneuver may relieve your vertigo for a few weeks, it is possible that your symptoms will return. This maneuver relieves vertigo, but it does not relieve dizziness. What are the risks? If it is done correctly, the Epley maneuver is considered safe. Sometimes it can lead to dizziness or nausea that goes away after a short time. If you develop other symptoms--such as changes in vision, weakness, or numbness--stop doing the maneuver and call your health care provider. Supplies needed: A bed or table. A pillow. How to do the Epley maneuver     Sit on the edge of a bed or table with your back straight and your legs extended or hanging over the edge of the bed or table. Turn your head halfway toward the affected ear or side as told by your health care provider. Lie backward quickly with your head turned until you are lying flat on your back. Your head should dangle (head-hanging position). You may want to position a pillow under your shoulders. Hold this position for at least 30 seconds. If you feel dizzy or have symptoms of vertigo, continue to hold the position until the symptoms stop. Turn your head to the opposite direction until your unaffected ear is facing down. Your head should continue to dangle. Hold this position for at least 30 seconds. If you feel dizzy or have symptoms of  vertigo, continue to hold the position until the symptoms stop. Turn your whole body to the same side as your head so that you are positioned on your side. Your head will now be nearly facedown and no longer needs to dangle. Hold for at least 30 seconds. If you feel dizzy or have symptoms of vertigo, continue to hold the position until the symptoms stop. Sit back up. You can repeat the maneuver in 24 hours if your vertigo does not go away. Follow these instructions at home: For 24 hours after doing the Epley maneuver: Keep your head in an upright position. When lying down to sleep or rest, keep your head raised (elevated) with two or more pillows. Avoid excessive neck movements. Activity Do not drive or use machinery if you feel dizzy. After doing the Epley maneuver, return to your normal activities as told by your health care provider. Ask your health care provider what activities are safe for you. General instructions Drink enough fluid to keep your urine pale yellow. Do not drink alcohol. Take over-the-counter and prescription medicines only as told by your health care provider. Keep all follow-up visits. This is important. Preventing vertigo symptoms Ask your health care provider if there is anything you should do at home to prevent vertigo. He or she may recommend that you: Keep your head elevated with two or more pillows while you sleep. Do not sleep on the side of your affected ear. Get   up slowly from bed. Avoid sudden movements during the day. Avoid extreme head positions or movement, such as looking up or bending over. Contact a health care provider if: Your vertigo gets worse. You have other symptoms, including: Nausea. Vomiting. Headache. Get help right away if you: Have vision changes. Have a headache or neck pain that is severe or getting worse. Cannot stop vomiting. Have new numbness or weakness in any part of your body. These symptoms may represent a serious problem  that is an emergency. Do not wait to see if the symptoms will go away. Get medical help right away. Call your local emergency services (911 in the [place] Do not drive yourself to the hospital. Summary Vertigo is the feeling that you or your surroundings are moving when they are not. The Epley maneuver is an exercise that relieves symptoms of vertigo. If the Epley maneuver is done correctly, it is considered safe. This information is not intended to replace advice given to you by your health care provider. Make sure you discuss any questions you have with your health care provider. Document Revised: 01/11/2020 Document Reviewed: 01/11/2020 Elsevier Patient Education  2023 Elsevier Inc.  

## 2022-02-03 ENCOUNTER — Ambulatory Visit: Payer: Medicare Other | Admitting: Family Medicine

## 2022-02-05 ENCOUNTER — Ambulatory Visit: Payer: Medicare Other | Admitting: Family Medicine

## 2022-03-06 DIAGNOSIS — Z1231 Encounter for screening mammogram for malignant neoplasm of breast: Secondary | ICD-10-CM | POA: Diagnosis not present

## 2022-03-06 LAB — HM MAMMOGRAPHY

## 2022-03-11 ENCOUNTER — Encounter: Payer: Self-pay | Admitting: Family Medicine

## 2022-03-20 DIAGNOSIS — L821 Other seborrheic keratosis: Secondary | ICD-10-CM | POA: Diagnosis not present

## 2022-03-20 DIAGNOSIS — D2262 Melanocytic nevi of left upper limb, including shoulder: Secondary | ICD-10-CM | POA: Diagnosis not present

## 2022-03-20 DIAGNOSIS — D225 Melanocytic nevi of trunk: Secondary | ICD-10-CM | POA: Diagnosis not present

## 2022-03-20 DIAGNOSIS — D2272 Melanocytic nevi of left lower limb, including hip: Secondary | ICD-10-CM | POA: Diagnosis not present

## 2022-03-20 DIAGNOSIS — C44712 Basal cell carcinoma of skin of right lower limb, including hip: Secondary | ICD-10-CM | POA: Diagnosis not present

## 2022-03-20 DIAGNOSIS — D485 Neoplasm of uncertain behavior of skin: Secondary | ICD-10-CM | POA: Diagnosis not present

## 2022-03-20 DIAGNOSIS — C44319 Basal cell carcinoma of skin of other parts of face: Secondary | ICD-10-CM | POA: Diagnosis not present

## 2022-03-20 DIAGNOSIS — D2271 Melanocytic nevi of right lower limb, including hip: Secondary | ICD-10-CM | POA: Diagnosis not present

## 2022-03-20 DIAGNOSIS — D2261 Melanocytic nevi of right upper limb, including shoulder: Secondary | ICD-10-CM | POA: Diagnosis not present

## 2022-03-20 DIAGNOSIS — L820 Inflamed seborrheic keratosis: Secondary | ICD-10-CM | POA: Diagnosis not present

## 2022-04-02 NOTE — Progress Notes (Unsigned)
I,Anna Mcmillan,acting as a scribe for [employer], MD.,have documented all relevant documentation on the behalf of Anna Foster, MD,as directed by  Anna Foster, MD while in the presence of Anna Foster, MD.   Established patient visit   Patient: Anna Mcmillan   DOB: [birth year]   77 y.o. Female  MRN: [id number] Visit Date: 04/03/2022  Today's healthcare provider: [name], MD   No chief complaint on file.  Subjective    HPI   Lipid/Cholesterol, Follow-up  Last lipid panel Other pertinent labs  Lab Results  Component Value Date   CHOL 303 (H) 08/14/2021   HDL 86 08/14/2021   LDLCALC 202 (H) 08/14/2021   TRIG 94 08/14/2021   CHOLHDL 3.5 08/14/2021   Lab Results  Component Value Date   ALT 20 08/14/2021   AST 26 08/14/2021   PLT 266 08/14/2021   TSH 0.989 08/14/2021     She was last seen for this 3 months ago.  Management since that visit includes lifestyle modifications. Elevated ASCVD score 32.7%, patient preferred to avoid cholesterol lowering medication  Has tried Zetia with reported intolerance  She has been cutting back on butter, mayo and snacks  She reports eating vegetables and fruit daily   Health update: she states that the vertigo symptoms have since resolves   Symptoms: No chest pain No chest pressure/discomfort  No dyspnea No lower extremity edema  No numbness or tingling of extremity No orthopnea  No palpitations No paroxysmal nocturnal dyspnea  No speech difficulty No syncope   Current diet: well balanced Current exercise: none  The 10-year ASCVD risk score (Arnett DK, et al., 2019) is: 38.7%  --------------------------------------------------------------------------------------------------- HTN: BP elevated initially in office  She states in the past she will occasionally had elevated BP  She recently dropped BP cuff  She normally checks BP periodically ata home  She  denies HA, blurry vision, chest pain    Medications: Outpatient Medications Prior to Visit  Medication Sig   amLODipine (NORVASC) 5 MG tablet TAKE 1 TABLET BY MOUTH EVERY DAY   Ascorbic Acid (VITAMIN C) 1000 MG tablet Take 1,000 mg by mouth daily.   mometasone (NASONEX) 50 MCG/ACT nasal spray PLACE 2 SPRAYS INTO THE NOSTRIL ONCE DAILY.   Multiple Vitamins-Minerals (MULTIVITAMIN GUMMIES WOMENS PO) Take by mouth daily. High in D3   Zinc 40 MG TABS Take 40 mg by mouth daily.   No facility-administered medications prior to visit.    Review of Systems     Objective    BP (!) 151/78 (BP Location: Left Arm)   Pulse 86   Resp 16   Wt 113 lb 14.4 oz (51.7 kg)   BMI 19.55 kg/m    Physical Exam Vitals reviewed.  Constitutional:      General: She is not in acute distress.    Appearance: Normal appearance. She is not ill-appearing, toxic-appearing or diaphoretic.  Eyes:     Conjunctiva/sclera: Conjunctivae normal.  Cardiovascular:     Rate and Rhythm: Normal rate and regular rhythm.     Pulses: Normal pulses.     Heart sounds: Normal heart sounds. No murmur heard.    No friction rub. No gallop.  Pulmonary:     Effort: Pulmonary effort is normal. No respiratory distress.     Breath sounds: Normal breath sounds. No stridor. No wheezing, rhonchi or rales.  Abdominal:     General: Bowel sounds are normal. There is no distension.     Palpations:  Abdomen is soft.     Tenderness: There is no abdominal tenderness.  Musculoskeletal:     Right lower leg: No edema.     Left lower leg: No edema.  Skin:    Findings: No erythema or rash.  Neurological:     Mental Status: She is alert and oriented to person, place, and time.      No results found for any visits on 04/03/22.  Assessment & Plan     Problem List Items Addressed This Visit       Cardiovascular and Mediastinum   Essential (primary) hypertension    Chronic  Uncontrolled, not at goal  Recommended checking home BP  daily 1-2 hours after BP medications  Will follow up in a month  Patient will continue amlodipine '5mg'$  daily         Other   Hypercholesteremia - Primary    Chronic  Not at goal with last LDL at 202, goal less than 70 Will repeat lipid panel today  Recommended to continue with dietary management limited fried foods and animal fats  No current lipid lowering agents at this time  Will discuss further recommendations pending results        Relevant Orders   Lipid panel     Return in about 1 month (around 05/02/2022) for HTN .        The entirety of the information documented in the History of Present Illness, Review of Systems and Physical Exam were personally obtained by me. Portions of this information were initially documented by Anna Mcmillan, CMA and reviewed by me for thoroughness and accuracy.Anna Foster, MD     Anna Foster, MD  [facility] [phone] (phone) [phone] (fax)  [place]

## 2022-04-03 ENCOUNTER — Ambulatory Visit (INDEPENDENT_AMBULATORY_CARE_PROVIDER_SITE_OTHER): Payer: Medicare HMO | Admitting: Family Medicine

## 2022-04-03 ENCOUNTER — Encounter: Payer: Self-pay | Admitting: Family Medicine

## 2022-04-03 VITALS — BP 151/78 | HR 86 | Resp 16 | Wt 113.9 lb

## 2022-04-03 DIAGNOSIS — E7800 Pure hypercholesterolemia, unspecified: Secondary | ICD-10-CM

## 2022-04-03 DIAGNOSIS — I10 Essential (primary) hypertension: Secondary | ICD-10-CM | POA: Diagnosis not present

## 2022-04-03 NOTE — Assessment & Plan Note (Signed)
Chronic  Uncontrolled, not at goal  Recommended checking home BP daily 1-2 hours after BP medications  Will follow up in a month  Patient will continue amlodipine '5mg'$  daily

## 2022-04-03 NOTE — Assessment & Plan Note (Addendum)
Chronic  Not at goal with last LDL at 202, goal less than 70 Will repeat lipid panel today  Recommended to continue with dietary management limited fried foods and animal fats  No current lipid lowering agents at this time  Will discuss further recommendations pending results

## 2022-04-04 LAB — LIPID PANEL
Chol/HDL Ratio: 3.2 ratio (ref 0.0–4.4)
Cholesterol, Total: 318 mg/dL — ABNORMAL HIGH (ref 100–199)
HDL: 99 mg/dL (ref >39)
LDL Chol Calc (NIH): 207 mg/dL — ABNORMAL HIGH (ref 0–99)
Triglycerides: 78 mg/dL (ref 0–149)
VLDL Cholesterol Cal: 12 mg/dL (ref 5–40)

## 2022-04-23 ENCOUNTER — Telehealth: Payer: Self-pay | Admitting: Family Medicine

## 2022-04-23 NOTE — Telephone Encounter (Signed)
Dare to schedule their annual wellness visit. Appointment made for 08/18/2022.  [place] Direct Dial: [phone]

## 2022-05-02 ENCOUNTER — Ambulatory Visit: Payer: Medicare HMO | Admitting: Family Medicine

## 2022-08-11 ENCOUNTER — Other Ambulatory Visit: Payer: Self-pay | Admitting: Family Medicine

## 2022-08-11 DIAGNOSIS — I10 Essential (primary) hypertension: Secondary | ICD-10-CM

## 2022-08-11 NOTE — Telephone Encounter (Signed)
Medication Refill - Medication: amLODipine (NORVASC) 5 MG tablet   Has the patient contacted their pharmacy? Yes.     Preferred Pharmacy (with phone number or street name):  [id number] - [place], [place] - [address] Phone: [phone]  Fax: [phone]      Has the patient been seen for an appointment in the last year OR does the patient have an upcoming appointment? Yes.    The patient hasn't had her medication for 2 days. Please assist patient further as soon as possible

## 2022-08-12 MED ORDER — AMLODIPINE BESYLATE 5 MG PO TABS: 5.0000 mg | ORAL_TABLET | Freq: Every day | ORAL | 0 refills | Status: DC

## 2022-08-12 NOTE — Telephone Encounter (Signed)
Requested Prescriptions  Pending Prescriptions Disp Refills   amLODipine (NORVASC) 5 MG tablet 90 tablet 0    Sig: Take 1 tablet (5 mg total) by mouth daily.     Cardiovascular: Calcium Channel Blockers 2 Failed - 08/11/2022 11:23 AM      Failed - Last BP in normal range    BP Readings from Last 1 Encounters:  04/03/22 (!) 151/78         Passed - Last Heart Rate in normal range    Pulse Readings from Last 1 Encounters:  04/03/22 86         Passed - Valid encounter within last 6 months    Recent Outpatient Visits           4 months ago Hypercholesteremia   [place] [facility] Simmons-Robinson, [place], MD   7 months ago Hypercholesteremia   [place] [facility] Simmons-Robinson, [place], MD   12 months ago Encounter for annual wellness visit (AWV) in Medicare patient   [facility] Bosie Clos, MD   1 year ago Essential (primary) hypertension   [place] [facility] Bosie Clos, MD   1 year ago Essential (primary) hypertension   [place] [facility] Bosie Clos, MD

## 2022-08-18 ENCOUNTER — Ambulatory Visit (INDEPENDENT_AMBULATORY_CARE_PROVIDER_SITE_OTHER): Payer: Medicare HMO

## 2022-08-18 VITALS — Ht 64.0 in | Wt 113.0 lb

## 2022-08-18 DIAGNOSIS — Z0000 Encounter for general adult medical examination without abnormal findings: Secondary | ICD-10-CM | POA: Diagnosis not present

## 2022-08-18 NOTE — Progress Notes (Signed)
Subjective:   Anna Mcmillan is a 77 y.o. female who presents for Medicare Annual (Subsequent) preventive examination.  Visit Complete: Virtual  I connected with  Anna Mcmillan on 08/18/22 by a audio enabled telemedicine application and verified that I am speaking with the correct person using two identifiers.  Patient Location: Home  Provider Location: Office/Clinic  I discussed the limitations of evaluation and management by telemedicine. The patient expressed understanding and agreed to proceed.  Patient Medicare AWV questionnaire was completed by the patient on 08/18/2022; I have confirmed that all information answered by patient is correct and no changes since this date.  Review of Systems         Objective:    There were no vitals filed for this visit. There is no height or weight on file to calculate BMI.     05/05/2018   11:19 AM 01/20/2017    1:16 PM 02/26/2016    2:07 PM 10/31/2015   10:47 AM 04/22/2015    8:53 PM 04/22/2015    4:35 PM 02/28/2015    2:25 PM  Advanced Directives  Does Patient Have a Medical Advance Directive? Yes Yes Yes No Yes Yes Yes  Type of Estate agent of [place];Living will Healthcare Power of [place];Living will Healthcare Power of [place];Living will  Living will Living will   Does patient want to make changes to medical advance directive?   No - Patient declined  No - Patient declined    Copy of Healthcare Power of Attorney in Chart? No - copy requested No - copy requested No - copy requested  No - copy requested      Current Medications (verified) Outpatient Encounter Medications as of 08/18/2022  Medication Sig   amLODipine (NORVASC) 5 MG tablet Take 1 tablet (5 mg total) by mouth daily.   Ascorbic Acid (VITAMIN C) 1000 MG tablet Take 1,000 mg by mouth daily.   mometasone (NASONEX) 50 MCG/ACT nasal spray PLACE 2 SPRAYS INTO THE NOSTRIL ONCE DAILY.   Multiple Vitamins-Minerals (MULTIVITAMIN GUMMIES WOMENS PO) Take by  mouth daily. High in D3   Zinc 40 MG TABS Take 40 mg by mouth daily.   No facility-administered encounter medications on file as of 08/18/2022.    Allergies (verified) Sulfa antibiotics, Sulfasalazine, Statins, and Tamsulosin   History: Past Medical History:  Diagnosis Date   Arthritis    BPPV (benign paroxysmal positional vertigo), bilateral 01/01/2022   Cancer (HCC) 02/25/2008   left breast    Graves disease 02/24/1994   H/O renal calculi 11/22/2014   Recurrent   Hypertension 02/24/1982   Pulmonary fibrosis (HCC) 02/24/2010   Thyroid disease    Past Surgical History:  Procedure Laterality Date   ABDOMINAL HYSTERECTOMY  2001   BREAST BIOPSY Left 2002   BREAST SURGERY Left 2002   lumpectomy,    COLONOSCOPY  2002   COSMETIC SURGERY Left    implant 1977   EXTRACORPOREAL SHOCK WAVE LITHOTRIPSY Left 08/13/2017   Procedure: EXTRACORPOREAL SHOCK WAVE LITHOTRIPSY (ESWL);  Surgeon: Vanna Scotland, MD;  Location: [facility] ORS;  Service: Urology;  Laterality: Left;   Family History  Problem Relation Age of Onset   Kidney disease Mother        Kidney failure/ received a kidney transplant when she was 17   Breast cancer Daughter 14       genetic negative   Social History   Socioeconomic History   Marital status: Widowed    Spouse name: Not on  file   Number of children: 3   Years of education: Not on file   Highest education level: Some college, no degree  Occupational History   Occupation: retired  Tobacco Use   Smoking status: Never   Smokeless tobacco: Never  Vaping Use   Vaping Use: Never used  Substance and Sexual Activity   Alcohol use: Yes    Comment: ocassionally wine   Drug use: No   Sexual activity: Not on file  Other Topics Concern   Not on file  Social History Narrative   Not on file   Social Determinants of Health   Financial Resource Strain: Low Risk  (05/05/2018)   Overall Financial Resource Strain (CARDIA)    Difficulty of Paying Living Expenses:  Not hard at all  Food Insecurity: No Food Insecurity (05/05/2018)   Hunger Vital Sign    Worried About Running Out of Food in the Last Year: Never true    Ran Out of Food in the Last Year: Never true  Transportation Needs: No Transportation Needs (05/05/2018)   PRAPARE - Administrator, Civil Service (Medical): No    Lack of Transportation (Non-Medical): No  Physical Activity: Inactive (05/05/2018)   Exercise Vital Sign    Days of Exercise per Week: 0 days    Minutes of Exercise per Session: 0 min  Stress: Stress Concern Present (05/05/2018)   [employer] of Occupational Health - Occupational Stress Questionnaire    Feeling of Stress : To some extent  Social Connections: Unknown (05/05/2018)   Social Connection and Isolation Panel [NHANES]    Frequency of Communication with Friends and Family: Patient declined    Frequency of Social Gatherings with Friends and Family: Patient declined    Attends Religious Services: Patient declined    Database administrator or Organizations: Patient declined    Attends Banker Meetings: Patient declined    Marital Status: Patient declined    Tobacco Counseling Counseling given: Not Answered   Clinical Intake:              How often do you need to have someone help you when you read instructions, pamphlets, or other written materials from your doctor or pharmacy?: (P) 1 - Never         Activities of Daily Living    08/18/2022    8:09 AM  In your present state of health, do you have any difficulty performing the following activities:  Hearing? 0  Vision? 0  Difficulty concentrating or making decisions? 0  Walking or climbing stairs? 0  Dressing or bathing? 0  Doing errands, shopping? 0  Preparing Food and eating ? N  Using the Toilet? N  In the past six months, have you accidently leaked urine? N  Do you have problems with loss of bowel control? N  Managing your Medications? N  Managing your  Finances? N  Housekeeping or managing your Housekeeping? N    Patient Care Team: Ronnald Ramp, MD as PCP - General (Family Medicine) Galen Manila, MD as Referring Physician (Ophthalmology)  Indicate any recent Medical Services you may have received from other than [facility] providers in the past year (date may be approximate).     Assessment:   This is a routine wellness examination for Abbrielle.  Hearing/Vision screen No results found.  Dietary issues and exercise activities discussed:     Goals Addressed   None    Depression Screen    08/14/2021   10:00  AM 05/07/2021    9:04 AM 01/22/2021   10:00 AM 07/18/2020    9:43 AM 10/13/2019    9:16 AM 05/05/2018   11:17 AM 01/20/2017    1:16 PM  PHQ 2/9 Scores  PHQ - 2 Score 1 1 0 0 0 1 0  PHQ- 9 Score 1 3 2 1 3       Fall Risk    08/18/2022    8:09 AM 08/14/2021   10:00 AM 05/07/2021    9:04 AM 01/22/2021   10:00 AM 07/18/2020    9:42 AM  Fall Risk   Falls in the past year? 0 0 0 0 0  Number falls in past yr: 0 0 0 0 0  Injury with Fall? 0 0 0 1 0  Risk for fall due to :  No Fall Risks No Fall Risks No Fall Risks   Follow up  Falls evaluation completed Falls evaluation completed      MEDICARE RISK AT HOME:   TIMED UP AND GO:  Was the test performed?  No    Cognitive Function:        Immunizations Immunization History  Administered Date(s) Administered   Influenza, High Dose Seasonal PF 11/22/2014, 10/31/2015, 01/20/2017   Influenza-Unspecified 11/24/2013   Pneumococcal Conjugate-13 05/24/2014   Pneumococcal Polysaccharide-23 02/08/2013   Tdap 09/24/2005    TDAP status: Up to date  Flu Vaccine status: Declined, Education has been provided regarding the importance of this vaccine but patient still declined. Advised may receive this vaccine at local pharmacy or Health Dept. Aware to provide a copy of the vaccination record if obtained from local pharmacy or Health Dept. Verbalized acceptance and  understanding.  Pneumococcal vaccine status: Up to date  Covid-19 vaccine status: Declined, Education has been provided regarding the importance of this vaccine but patient still declined. Advised may receive this vaccine at local pharmacy or Health Dept.or vaccine clinic. Aware to provide a copy of the vaccination record if obtained from local pharmacy or Health Dept. Verbalized acceptance and understanding.  Qualifies for Shingles Vaccine? Yes   Zostavax completed No   Shingrix Completed?: No.    Education has been provided regarding the importance of this vaccine. Patient has been advised to call insurance company to determine out of pocket expense if they have not yet received this vaccine. Advised may also receive vaccine at local pharmacy or Health Dept. Verbalized acceptance and understanding.  Screening Tests Health Maintenance  Topic Date Due   COVID-19 Vaccine (1) Never done   Zoster Vaccines- Shingrix (1 of 2) Never done   DTaP/Tdap/Td (2 - Td or Tdap) 09/25/2015   Medicare Annual Wellness (AWV)  08/15/2022   INFLUENZA VACCINE  09/25/2022   DEXA SCAN  08/25/2023   Pneumonia Vaccine 25+ Years old  Completed   Hepatitis C Screening  Completed   HPV VACCINES  Aged Out   Colonoscopy  Discontinued    Health Maintenance  Health Maintenance Due  Topic Date Due   COVID-19 Vaccine (1) Never done   Zoster Vaccines- Shingrix (1 of 2) Never done   DTaP/Tdap/Td (2 - Td or Tdap) 09/25/2015   Medicare Annual Wellness (AWV)  08/15/2022    Colorectal cancer screening: No longer required.   Mammogram status: No longer required due to age.  Bone Density status: Completed yes. Results reflect: Bone density results: OSTEOPENIA. Repeat every 3 years.  Lung Cancer Screening: (Low Dose CT Chest recommended if Age 62-80 years, 20 pack-year currently smoking OR have  quit w/in 15years.) does not qualify.   Lung Cancer Screening Referral: no  Additional Screening:  Hepatitis C  Screening: does not qualify; Completed yes   Vision Screening: Recommended annual ophthalmology exams for early detection of glaucoma and other disorders of the eye. Is the patient up to date with their annual eye exam?  Yes  Who is the provider or what is the name of the office in which the patient attends annual eye exams? Dr Azucena Cecil If pt is not established with a provider, would they like to be referred to a provider to establish care? No .   Dental Screening: Recommended annual dental exams for proper oral hygiene  Diabetic Foot Exam:   Community Resource Referral / Chronic Care Management: CRR required this visit?  No   CCM required this visit?  No PE appt made with PCP     Plan:     I have personally reviewed and noted the following in the patient's chart:   Medical and social history Use of alcohol, tobacco or illicit drugs  Current medications and supplements including opioid prescriptions. Patient is not currently taking opioid prescriptions. Functional ability and status Nutritional status Physical activity Advanced directives List of other physicians Hospitalizations, surgeries, and ER visits in previous 12 months Vitals Screenings to include cognitive, depression, and falls Referrals and appointments  In addition, I have reviewed and discussed with patient certain preventive protocols, quality metrics, and best practice recommendations. A written personalized care plan for preventive services as well as general preventive health recommendations were provided to patient.     Sue Lush, LPN   [id number]/60/[id number]   After Visit Summary: (MyChart) Due to this being a telephonic visit, the after visit summary with patients personalized plan was offered to patient via MyChart   Nurse Notes: Pt states she returned from [place] in [place] with a four day "virus", reporting chills, sweats and fever up to 102 (high). She relays she is feeling much better and on the  mend. She also relays she had an abrasion on her leg that was slow healing. Pt encouraged to reach out for appt if both areas mentioned worsen or she is not getting better. She has no concerns or questions at this time.

## 2022-08-18 NOTE — Patient Instructions (Signed)
[name] , Thank you for taking time to come for your Medicare Wellness Visit. I appreciate your ongoing commitment to your health goals. Please review the following plan we discussed and let me know if I can assist you in the future.   These are the goals we discussed:  Goals      DIET - INCREASE WATER INTAKE     Recommend increasing water intake to 4-6 glasses of water a day.         This is a list of the screening recommended for you and due dates:  Health Maintenance  Topic Date Due   COVID-19 Vaccine (1) Never done   Zoster (Shingles) Vaccine (1 of 2) Never done   DTaP/Tdap/Td vaccine (2 - Td or Tdap) 09/25/2015   Flu Shot  09/25/2022   Medicare Annual Wellness Visit  08/18/2023   DEXA scan (bone density measurement)  08/25/2023   Pneumonia Vaccine  Completed   Hepatitis C Screening  Completed   HPV Vaccine  Aged Out   Colon Cancer Screening  Discontinued    Advanced directives: yes  Conditions/risks identified: none  Next appointment: Follow up in one year for your annual wellness visit 08/19/2023 @ 8:15am telephone   Preventive Care 65 Years and Older, Female Preventive care refers to lifestyle choices and visits with your health care provider that can promote health and wellness. What does preventive care include? A yearly physical exam. This is also called an annual well check. Dental exams once or twice a year. Routine eye exams. Ask your health care provider how often you should have your eyes checked. Personal lifestyle choices, including: Daily care of your teeth and gums. Regular physical activity. Eating a healthy diet. Avoiding tobacco and drug use. Limiting alcohol use. Practicing safe sex. Taking low-dose aspirin every day. Taking vitamin and mineral supplements as recommended by your health care provider. What happens during an annual well check? The services and screenings done by your health care provider during your annual well check will  depend on your age, overall health, lifestyle risk factors, and family history of disease. Counseling  Your health care provider may ask you questions about your: Alcohol use. Tobacco use. Drug use. Emotional well-being. Home and relationship well-being. Sexual activity. Eating habits. History of falls. Memory and ability to understand (cognition). Work and work Astronomer. Reproductive health. Screening  You may have the following tests or measurements: Height, weight, and BMI. Blood pressure. Lipid and cholesterol levels. These may be checked every 5 years, or more frequently if you are over 51 years old. Skin check. Lung cancer screening. You may have this screening every year starting at age 75 if you have a 30-pack-year history of smoking and currently smoke or have quit within the past 15 years. Fecal occult blood test (FOBT) of the stool. You may have this test every year starting at age 55. Flexible sigmoidoscopy or colonoscopy. You may have a sigmoidoscopy every 5 years or a colonoscopy every 10 years starting at age 32. Hepatitis C blood test. Hepatitis B blood test. Sexually transmitted disease (STD) testing. Diabetes screening. This is done by checking your blood sugar (glucose) after you have not eaten for a while (fasting). You may have this done every 1-3 years. Bone density scan. This is done to screen for osteoporosis. You may have this done starting at age 56. Mammogram. This may be done every 1-2 years. Talk to your health care provider about how often you should have regular mammograms.  Talk with your health care provider about your test results, treatment options, and if necessary, the need for more tests. Vaccines  Your health care provider may recommend certain vaccines, such as: Influenza vaccine. This is recommended every year. Tetanus, diphtheria, and acellular pertussis (Tdap, Td) vaccine. You may need a Td booster every 10 years. Zoster vaccine. You may  need this after age 7. Pneumococcal 13-valent conjugate (PCV13) vaccine. One dose is recommended after age 38. Pneumococcal polysaccharide (PPSV23) vaccine. One dose is recommended after age 20. Talk to your health care provider about which screenings and vaccines you need and how often you need them. This information is not intended to replace advice given to you by your health care provider. Make sure you discuss any questions you have with your health care provider. Document Released: 03/09/2015 Document Revised: 10/31/2015 Document Reviewed: 12/12/2014 Elsevier Interactive Patient Education  2017 [place] Prevention in the Home Falls can cause injuries. They can happen to people of all ages. There are many things you can do to make your home safe and to help prevent falls. What can I do on the outside of my home? Regularly fix the edges of walkways and driveways and fix any cracks. Remove anything that might make you trip as you walk through a door, such as a raised step or threshold. Trim any bushes or trees on the path to your home. Use bright outdoor lighting. Clear any walking paths of anything that might make someone trip, such as rocks or tools. Regularly check to see if handrails are loose or broken. Make sure that both sides of any steps have handrails. Any raised decks and porches should have guardrails on the edges. Have any leaves, snow, or ice cleared regularly. Use sand or salt on walking paths during winter. Clean up any spills in your garage right away. This includes oil or grease spills. What can I do in the bathroom? Use night lights. Install grab bars by the toilet and in the tub and shower. Do not use towel bars as grab bars. Use non-skid mats or decals in the tub or shower. If you need to sit down in the shower, use a plastic, non-slip stool. Keep the floor dry. Clean up any water that spills on the floor as soon as it happens. Remove soap buildup in  the tub or shower regularly. Attach bath mats securely with double-sided non-slip rug tape. Do not have throw rugs and other things on the floor that can make you trip. What can I do in the bedroom? Use night lights. Make sure that you have a light by your bed that is easy to reach. Do not use any sheets or blankets that are too big for your bed. They should not hang down onto the floor. Have a firm chair that has side arms. You can use this for support while you get dressed. Do not have throw rugs and other things on the floor that can make you trip. What can I do in the kitchen? Clean up any spills right away. Avoid walking on wet floors. Keep items that you use a lot in easy-to-reach places. If you need to reach something above you, use a strong step stool that has a grab bar. Keep electrical cords out of the way. Do not use floor polish or wax that makes floors slippery. If you must use wax, use non-skid floor wax. Do not have throw rugs and other things on the floor that can make  you trip. What can I do with my stairs? Do not leave any items on the stairs. Make sure that there are handrails on both sides of the stairs and use them. Fix handrails that are broken or loose. Make sure that handrails are as long as the stairways. Check any carpeting to make sure that it is firmly attached to the stairs. Fix any carpet that is loose or worn. Avoid having throw rugs at the top or bottom of the stairs. If you do have throw rugs, attach them to the floor with carpet tape. Make sure that you have a light switch at the top of the stairs and the bottom of the stairs. If you do not have them, ask someone to add them for you. What else can I do to help prevent falls? Wear shoes that: Do not have high heels. Have rubber bottoms. Are comfortable and fit you well. Are closed at the toe. Do not wear sandals. If you use a stepladder: Make sure that it is fully opened. Do not climb a closed  stepladder. Make sure that both sides of the stepladder are locked into place. Ask someone to hold it for you, if possible. Clearly mark and make sure that you can see: Any grab bars or handrails. First and last steps. Where the edge of each step is. Use tools that help you move around (mobility aids) if they are needed. These include: Canes. Walkers. Scooters. Crutches. Turn on the lights when you go into a dark area. Replace any light bulbs as soon as they burn out. Set up your furniture so you have a clear path. Avoid moving your furniture around. If any of your floors are uneven, fix them. If there are any pets around you, be aware of where they are. Review your medicines with your doctor. Some medicines can make you feel dizzy. This can increase your chance of falling. Ask your doctor what other things that you can do to help prevent falls. This information is not intended to replace advice given to you by your health care provider. Make sure you discuss any questions you have with your health care provider. Document Released: 12/07/2008 Document Revised: 07/19/2015 Document Reviewed: 03/17/2014 Elsevier Interactive Patient Education  2017 [employer].

## 2022-09-24 NOTE — Progress Notes (Unsigned)
Complete physical exam   Patient: Anna Mcmillan   DOB: [birth year]   77 y.o. Female  MRN: [id number] Visit Date: 09/25/2022  Today's healthcare provider: Ronnald Ramp, MD   No chief complaint on file.  Subjective    Anna Mcmillan is a 77 y.o. female who presents today for a complete physical exam.   She reports consuming a {diet types:17450} diet.   {Exercise:19826} She generally feels {well/fairly well/poorly:18703}.   She reports sleeping {well/fairly well/poorly:18703}.    She {does/does not:200015} have additional problems to discuss today.     Past Medical History:  Diagnosis Date   Arthritis    BPPV (benign paroxysmal positional vertigo), bilateral 01/01/2022   Cancer (HCC) 02/25/2008   left breast    Graves disease 02/24/1994   H/O renal calculi 11/22/2014   Recurrent   Hypertension 02/24/1982   Pulmonary fibrosis (HCC) 02/24/2010   Thyroid disease    Past Surgical History:  Procedure Laterality Date   ABDOMINAL HYSTERECTOMY  2001   BREAST BIOPSY Left 2002   BREAST SURGERY Left 2002   lumpectomy,    COLONOSCOPY  2002   COSMETIC SURGERY Left    implant 1977   EXTRACORPOREAL SHOCK WAVE LITHOTRIPSY Left 08/13/2017   Procedure: EXTRACORPOREAL SHOCK WAVE LITHOTRIPSY (ESWL);  Surgeon: Vanna Scotland, MD;  Location: [facility];  Service: Urology;  Laterality: Left;   Social History   Socioeconomic History   Marital status: Widowed    Spouse name: Not on file   Number of children: 3   Years of education: Not on file   Highest education level: Some college, no degree  Occupational History   Occupation: retired  Tobacco Use   Smoking status: Never   Smokeless tobacco: Never  Vaping Use   Vaping status: Never Used  Substance and Sexual Activity   Alcohol use: Yes    Comment: ocassionally wine   Drug use: No   Sexual activity: Not on file  Other Topics Concern   Not on file  Social History Narrative   Not on file   Social  Determinants of Health   Financial Resource Strain: Low Risk  (08/18/2022)   Overall Financial Resource Strain (CARDIA)    Difficulty of Paying Living Expenses: Not hard at all  Food Insecurity: No Food Insecurity (08/18/2022)   Hunger Vital Sign    Worried About Running Out of Food in the Last Year: Never true    Ran Out of Food in the Last Year: Never true  Transportation Needs: No Transportation Needs (08/18/2022)   PRAPARE - Administrator, Civil Service (Medical): No    Lack of Transportation (Non-Medical): No  Physical Activity: Sufficiently Active (08/18/2022)   Exercise Vital Sign    Days of Exercise per Week: 5 days    Minutes of Exercise per Session: 30 min  Stress: No Stress Concern Present (08/18/2022)   [employer] of Occupational Health - Occupational Stress Questionnaire    Feeling of Stress : Only a little  Social Connections: Unknown (08/18/2022)   Social Connection and Isolation Panel [NHANES]    Frequency of Communication with Friends and Family: More than three times a week    Frequency of Social Gatherings with Friends and Family: More than three times a week    Attends Religious Services: Not on file    Active Member of Clubs or Organizations: Yes    Attends Banker Meetings: 1 to 4 times per year  Marital Status: Widowed  Intimate Partner Violence: Not At Risk (08/18/2022)   Humiliation, Afraid, Rape, and Kick questionnaire    Fear of Current or Ex-Partner: No    Emotionally Abused: No    Physically Abused: No    Sexually Abused: No   Family Status  Relation Name Status   Mother  Deceased   Father  Deceased at age 69       Biological father MVA   Sister  Deceased   Brother 1 Deceased       died from a MVA   Brother 2 Alive   Brother 3 Alive   Brother 4 Corporate treasurer  (Not Specified)  No partnership data on file   Family History  Problem Relation Age of Onset   Kidney disease Mother        Kidney failure/ received a  kidney transplant when she was 60   Breast cancer Daughter 79       genetic negative   Allergies  Allergen Reactions   Sulfa Antibiotics Anaphylaxis and Rash   Sulfasalazine Anaphylaxis and Rash   Statins Other (See Comments)    Severe abdominal pain.   Tamsulosin     Dry cough     Medications: Outpatient Medications Prior to Visit  Medication Sig   amLODipine (NORVASC) 5 MG tablet Take 1 tablet (5 mg total) by mouth daily.   Ascorbic Acid (VITAMIN C) 1000 MG tablet Take 1,000 mg by mouth daily.   mometasone (NASONEX) 50 MCG/ACT nasal spray PLACE 2 SPRAYS INTO THE NOSTRIL ONCE DAILY.   Multiple Vitamins-Minerals (MULTIVITAMIN GUMMIES WOMENS PO) Take by mouth daily. High in D3   Zinc 40 MG TABS Take 40 mg by mouth daily.   No facility-administered medications prior to visit.    Review of Systems  {Insert previous labs (optional):23779}  {See past labs  Heme  Chem  Endocrine  Serology  Results Review (optional):1}  Objective    There were no vitals taken for this visit. {Insert last BP/Wt (optional):23777}  {See vitals history (optional):1}   Physical Exam  ***  Last depression screening scores    08/18/2022    8:31 AM 08/14/2021   10:00 AM 05/07/2021    9:04 AM  PHQ 2/9 Scores  PHQ - 2 Score 0 1 1  PHQ- 9 Score  1 3    Last fall risk screening    08/18/2022    8:09 AM  Fall Risk   Falls in the past year? 0  Number falls in past yr: 0  Injury with Fall? 0  Risk for fall due to : No Fall Risks  Follow up Education provided;Falls prevention discussed    Last Audit-C alcohol use screening    08/18/2022    8:09 AM  Alcohol Use Disorder Test (AUDIT)  1. How often do you have a drink containing alcohol? 3  2. How many drinks containing alcohol do you have on a typical day when you are drinking? 0  3. How often do you have six or more drinks on one occasion? 0  AUDIT-C Score 3   A score of 3 or more in women, and 4 or more in men indicates increased  risk for alcohol abuse, EXCEPT if all of the points are from question 1   No results found for any visits on 09/25/22.  Assessment & Plan    Routine Health Maintenance and Physical Exam  Immunization History  Administered Date(s) Administered   Influenza, High  Dose Seasonal PF 11/22/2014, 10/31/2015, 01/20/2017   Influenza-Unspecified 11/24/2013   Pneumococcal Conjugate-13 05/24/2014   Pneumococcal Polysaccharide-23 02/08/2013   Tdap 09/24/2005    Health Maintenance  Topic Date Due   COVID-19 Vaccine (1) Never done   Zoster Vaccines- Shingrix (1 of 2) Never done   DTaP/Tdap/Td (2 - Td or Tdap) 09/25/2015   INFLUENZA VACCINE  09/25/2022   Medicare Annual Wellness (AWV)  08/18/2023   DEXA SCAN  08/25/2023   Pneumonia Vaccine 71+ Years old  Completed   Hepatitis C Screening  Completed   HPV VACCINES  Aged Out   Colonoscopy  Discontinued    Problem List Items Addressed This Visit   None    No follow-ups on file.       Ronnald Ramp, MD  [facility] [phone] (phone) [phone] (fax)  [facility]

## 2022-09-25 ENCOUNTER — Encounter: Payer: Self-pay | Admitting: Family Medicine

## 2022-09-25 ENCOUNTER — Ambulatory Visit: Payer: Medicare HMO | Admitting: Family Medicine

## 2022-09-25 VITALS — BP 128/76 | HR 94 | Temp 98.2°F | Resp 12 | Ht 64.0 in | Wt 118.3 lb

## 2022-09-25 DIAGNOSIS — Z130 Encounter for screening for diseases of the blood and blood-forming organs and certain disorders involving the immune mechanism: Secondary | ICD-10-CM | POA: Diagnosis not present

## 2022-09-25 DIAGNOSIS — L989 Disorder of the skin and subcutaneous tissue, unspecified: Secondary | ICD-10-CM

## 2022-09-25 DIAGNOSIS — Z13220 Encounter for screening for lipoid disorders: Secondary | ICD-10-CM | POA: Diagnosis not present

## 2022-09-25 DIAGNOSIS — E7800 Pure hypercholesterolemia, unspecified: Secondary | ICD-10-CM | POA: Diagnosis not present

## 2022-09-25 DIAGNOSIS — Z131 Encounter for screening for diabetes mellitus: Secondary | ICD-10-CM

## 2022-09-25 DIAGNOSIS — I10 Essential (primary) hypertension: Secondary | ICD-10-CM | POA: Diagnosis not present

## 2022-09-25 DIAGNOSIS — Z0000 Encounter for general adult medical examination without abnormal findings: Secondary | ICD-10-CM

## 2022-09-25 DIAGNOSIS — H9012 Conductive hearing loss, unilateral, left ear, with unrestricted hearing on the contralateral side: Secondary | ICD-10-CM

## 2022-09-25 NOTE — Patient Instructions (Addendum)
    A referral has been placed on your behalf for ENT for hearing loss. Our referral coordination team or the office you will be visiting will contact you within the next 2 weeks.  If you have not received a phone call within 10 business days please let us know so that we can check into this for you.

## 2022-09-25 NOTE — Assessment & Plan Note (Signed)
Chronic conditions are stable  Patient was counseled on benefits of regular physical activity with goal of 150 minutes of moderate to vigurous intensity 4 days per week  Patient was counseled to consume well balanced diet of fruits, vegetables, limited saturated fats and limited sugary foods and beverages with emphasis on consuming 6-8 glasses of water daily  Screening recommended today: A1c, lipids,CBC Vaccines recommended today: COVID,Shingrix (patient declined both)

## 2022-09-25 NOTE — Assessment & Plan Note (Signed)
Chronic problem that has been progressing  Predominantly left-sided hearing loss reported. -Refer to audiology for evaluation.

## 2022-09-25 NOTE — Assessment & Plan Note (Signed)
Lesions on leg and hip with history of non-healing, bleeding, and recent trauma. Concern for possible precancerous or cancerous changes given history of Mohs surgery. -Refer to dermatology for evaluation and possible biopsy.

## 2022-09-25 NOTE — Assessment & Plan Note (Signed)
Chronic  Will repeat lipid panel today  Recommended to continue with dietary management limited fried foods and animal fats  No current lipid lowering agents at this time

## 2022-09-25 NOTE — Assessment & Plan Note (Signed)
Chronic  Controlled, at goal  Patient will continue amlodipine 5mg  daily

## 2022-09-26 NOTE — Progress Notes (Signed)
  Your LDL (bad cholesterol) was 811, goal is less than 100   Total cholesterol is elevated at 340, goal is less than 200.   In addition to the lifestyle recommendations, I would recommend referral for lipid clinic with cardiology. Please let me know if patient is agreeable to this referral.   A1c was in pre-diabetes range. This can be improved with dietary management limiting sugary beverages, rice, pasta, bread and desserts and regular physical activity.   All other labs were within normal range and are listed in detail for patient comments.

## 2022-10-01 ENCOUNTER — Other Ambulatory Visit: Payer: Self-pay | Admitting: Family Medicine

## 2022-10-01 DIAGNOSIS — E7800 Pure hypercholesterolemia, unspecified: Secondary | ICD-10-CM

## 2022-10-30 DIAGNOSIS — H903 Sensorineural hearing loss, bilateral: Secondary | ICD-10-CM | POA: Diagnosis not present

## 2022-10-30 DIAGNOSIS — H9202 Otalgia, left ear: Secondary | ICD-10-CM | POA: Diagnosis not present

## 2022-11-01 ENCOUNTER — Other Ambulatory Visit: Payer: Self-pay | Admitting: Family Medicine

## 2022-11-01 DIAGNOSIS — I10 Essential (primary) hypertension: Secondary | ICD-10-CM

## 2022-11-06 DIAGNOSIS — L578 Other skin changes due to chronic exposure to nonionizing radiation: Secondary | ICD-10-CM | POA: Diagnosis not present

## 2022-11-06 DIAGNOSIS — L814 Other melanin hyperpigmentation: Secondary | ICD-10-CM | POA: Diagnosis not present

## 2022-11-06 DIAGNOSIS — C44319 Basal cell carcinoma of skin of other parts of face: Secondary | ICD-10-CM | POA: Diagnosis not present

## 2022-11-06 DIAGNOSIS — L570 Actinic keratosis: Secondary | ICD-10-CM | POA: Diagnosis not present

## 2022-11-06 DIAGNOSIS — L988 Other specified disorders of the skin and subcutaneous tissue: Secondary | ICD-10-CM | POA: Diagnosis not present

## 2022-11-19 DIAGNOSIS — C44712 Basal cell carcinoma of skin of right lower limb, including hip: Secondary | ICD-10-CM | POA: Diagnosis not present

## 2022-12-09 DIAGNOSIS — H524 Presbyopia: Secondary | ICD-10-CM | POA: Diagnosis not present

## 2022-12-09 DIAGNOSIS — H52223 Regular astigmatism, bilateral: Secondary | ICD-10-CM | POA: Diagnosis not present

## 2023-02-04 ENCOUNTER — Other Ambulatory Visit: Payer: Self-pay | Admitting: Family Medicine

## 2023-02-04 DIAGNOSIS — I10 Essential (primary) hypertension: Secondary | ICD-10-CM

## 2023-05-20 ENCOUNTER — Telehealth: Payer: Self-pay

## 2023-05-20 DIAGNOSIS — Z1231 Encounter for screening mammogram for malignant neoplasm of breast: Secondary | ICD-10-CM

## 2023-05-20 NOTE — Telephone Encounter (Unsigned)
 Copied from CRM [id number]. Topic: Appointments - Scheduling Inquiry for Clinic >> May 20, 2023  9:04 AM Higinio Roger wrote: Reason for CRM: Patient would like to schedule her mammogram, locally if possible.   Callback #: [phone]

## 2023-05-21 NOTE — Telephone Encounter (Signed)
 Called but was only able to leave a voice message informing the pt, a order has been place she needs to call [facility] to get scheduled. She has been provider with the contact information

## 2023-05-22 DIAGNOSIS — L821 Other seborrheic keratosis: Secondary | ICD-10-CM | POA: Diagnosis not present

## 2023-05-22 DIAGNOSIS — L814 Other melanin hyperpigmentation: Secondary | ICD-10-CM | POA: Diagnosis not present

## 2023-05-22 DIAGNOSIS — D2262 Melanocytic nevi of left upper limb, including shoulder: Secondary | ICD-10-CM | POA: Diagnosis not present

## 2023-05-22 DIAGNOSIS — D2271 Melanocytic nevi of right lower limb, including hip: Secondary | ICD-10-CM | POA: Diagnosis not present

## 2023-05-22 DIAGNOSIS — D2272 Melanocytic nevi of left lower limb, including hip: Secondary | ICD-10-CM | POA: Diagnosis not present

## 2023-05-22 DIAGNOSIS — D2261 Melanocytic nevi of right upper limb, including shoulder: Secondary | ICD-10-CM | POA: Diagnosis not present

## 2023-05-22 DIAGNOSIS — D225 Melanocytic nevi of trunk: Secondary | ICD-10-CM | POA: Diagnosis not present

## 2023-08-08 ENCOUNTER — Other Ambulatory Visit: Payer: Self-pay | Admitting: Family Medicine

## 2023-08-08 DIAGNOSIS — I10 Essential (primary) hypertension: Secondary | ICD-10-CM

## 2023-08-19 ENCOUNTER — Ambulatory Visit: Payer: Medicare Other

## 2023-08-19 DIAGNOSIS — Z1231 Encounter for screening mammogram for malignant neoplasm of breast: Secondary | ICD-10-CM

## 2023-08-19 DIAGNOSIS — Z780 Asymptomatic menopausal state: Secondary | ICD-10-CM

## 2023-08-19 DIAGNOSIS — Z0000 Encounter for general adult medical examination without abnormal findings: Secondary | ICD-10-CM | POA: Diagnosis not present

## 2023-08-19 NOTE — Patient Instructions (Addendum)
 Anna Mcmillan , Thank you for taking time out of your busy schedule to complete your Annual Wellness Visit with me. I enjoyed our conversation and look forward to speaking with you again next year. I, as well as your care team,  appreciate your ongoing commitment to your health goals. Please review the following plan we discussed and let me know if I can assist you in the future.   You have an order for:  []   2D Mammogram  [x]   3D Mammogram  [x]   Bone Density     Please call for appointment:  [facility]  [address] [place] [address] [phone] [facility] [address] [place], [place] [address] [phone] [facility] [name] [place], [place] [address] [phone]   Make sure to wear two-piece clothing.  No lotions, powders, or deodorants the day of the appointment. Make sure to bring picture ID and insurance card.  Bring list of medications you are currently taking including any supplements.   Schedule your  screening mammogram through MyChart!   Log into your MyChart account.  Go to 'Visit' (or 'Appointments' if on mobile App) --> Schedule an Appointment  Under 'Select a Reason for Visit' choose the Mammogram Screening option.  Complete the pre-visit questions and select the time and place that best fits your schedule.   Follow up Visits: Next Medicare AWV with our clinical staff:   08/23/24 @ 3:50 PM BY PHONE Have you seen your provider in the last 6 months (3 months if uncontrolled diabetes)? Yes   Clinician Recommendations:  Aim for 30 minutes of exercise or brisk walking, 6-8 glasses of water, and 5 servings of fruits and vegetables each day. TAKE CARE!      This is a list of the screening recommended for you and due dates:  Health Maintenance  Topic Date Due   COVID-19 Vaccine (1) Never done   Zoster  (Shingles) Vaccine (1 of 2) Never done   DTaP/Tdap/Td vaccine (2 - Td or Tdap) 09/25/2015   DEXA scan (bone density measurement)  08/25/2023   Flu Shot  09/25/2023   Medicare Annual Wellness Visit  08/18/2024   Pneumococcal Vaccine for age over 65  Completed   Hepatitis C Screening  Completed   Hepatitis B Vaccine  Aged Out   HPV Vaccine  Aged Out   Meningitis B Vaccine  Aged Out   Colon Cancer Screening  Discontinued    Advanced directives: (ACP Link)Information on Advanced Care Planning can be found at [employer] of [facility] Advance Health Care Directives Advance Health Care Directives. [email or web address].[email or web address]  Advance Care Planning is important because it:  [x]  Makes sure you receive the medical care that is consistent with your values, goals, and preferences  [x]  It provides guidance to your family and loved ones and reduces their decisional burden about whether or not they are making the right decisions based on your wishes.  Follow the link provided in your after visit summary or read over the paperwork we have mailed to you to help you started getting your Advance Directives in place. If you need assistance in completing these, please reach out to us  so that we can help you!

## 2023-08-19 NOTE — Progress Notes (Signed)
 Subjective:   Anna Mcmillan is a 78 y.o. who presents for a Medicare Wellness preventive visit.  As a reminder, Annual Wellness Visits don't include a physical exam, and some assessments may be limited, especially if this visit is performed virtually. We may recommend an in-person follow-up visit with your provider if needed.  Visit Complete: Virtual I connected with  Anna Mcmillan on 08/19/23 by a audio enabled telemedicine application and verified that I am speaking with the correct person using two identifiers.  Patient Location: Home  Provider Location: Home Office  I discussed the limitations of evaluation and management by telemedicine. The patient expressed understanding and agreed to proceed.  Vital Signs: Because this visit was a virtual/telehealth visit, some criteria may be missing or patient reported. Any vitals not documented were not able to be obtained and vitals that have been documented are patient reported.  VideoDeclined- This patient declined Librarian, academic. Therefore the visit was completed with audio only.  Persons Participating in Visit: Patient.  AWV Questionnaire: No: Patient Medicare AWV questionnaire was not completed prior to this visit.  Cardiac Risk Factors include: advanced age (>63men, >86 women);hypertension;dyslipidemia;sedentary lifestyle     Objective:    There were no vitals filed for this visit. There is no height or weight on file to calculate BMI.     08/19/2023    8:21 AM 08/18/2022    8:33 AM 05/05/2018   11:19 AM 01/20/2017    1:16 PM 02/26/2016    2:07 PM 10/31/2015   10:47 AM 04/22/2015    8:53 PM  Advanced Directives  Does Patient Have a Medical Advance Directive? No Yes Yes  Yes  Yes  No  Yes   Type of Special educational needs teacher of [place];Living will Healthcare Power of [place];Living will Healthcare Power of [place];Living will Healthcare Power of [place];Living will  Living will    Does patient want to make changes to medical advance directive?     No - Patient declined   No - Patient declined   Copy of Healthcare Power of Attorney in Chart?   No - copy requested  No - copy requested  No - copy requested   No - copy requested   Would patient like information on creating a medical advance directive? No - Patient declined           Data saved with a previous flowsheet row definition    Current Medications (verified) Outpatient Encounter Medications as of 08/19/2023  Medication Sig   amLODipine  (NORVASC ) 5 MG tablet TAKE 1 TABLET (5 MG TOTAL) BY MOUTH DAILY.   mometasone  (NASONEX ) 50 MCG/ACT nasal spray PLACE 2 SPRAYS INTO THE NOSTRIL ONCE DAILY.   Multiple Vitamins-Minerals (MULTIVITAMIN GUMMIES WOMENS PO) Take by mouth daily. High in D3   Ascorbic Acid (VITAMIN C) 1000 MG tablet Take 1,000 mg by mouth daily. (Patient not taking: Reported on 08/19/2023)   Zinc 40 MG TABS Take 40 mg by mouth daily. (Patient not taking: Reported on 08/19/2023)   No facility-administered encounter medications on file as of 08/19/2023.    Allergies (verified) Sulfa antibiotics, Sulfasalazine, Statins, and Tamsulosin    History: Past Medical History:  Diagnosis Date   Arthritis    BPPV (benign paroxysmal positional vertigo), bilateral 01/01/2022   Cancer (HCC) 02/25/2008   left breast    Graves disease 02/24/1994   H/O renal calculi 11/22/2014   Recurrent   Hypertension 02/24/1982   Pulmonary fibrosis (HCC) 02/24/2010  Thyroid  disease    Past Surgical History:  Procedure Laterality Date   ABDOMINAL HYSTERECTOMY  2001   BREAST BIOPSY Left 2002   BREAST SURGERY Left 2002   lumpectomy,    COLONOSCOPY  2002   COSMETIC SURGERY Left    implant 1977   EXTRACORPOREAL SHOCK WAVE LITHOTRIPSY Left 08/13/2017   Procedure: EXTRACORPOREAL SHOCK WAVE LITHOTRIPSY (ESWL);  Surgeon: Penne Knee, MD;  Location: [facility] ORS;  Service: Urology;  Laterality: Left;   Family History  Problem  Relation Age of Onset   Kidney disease Mother        Kidney failure/ received a kidney transplant when she was 11   Breast cancer Daughter 94       genetic negative   Social History   Socioeconomic History   Marital status: Widowed    Spouse name: Not on file   Number of children: 3   Years of education: Not on file   Highest education level: Some college, no degree  Occupational History   Occupation: retired  Tobacco Use   Smoking status: Never   Smokeless tobacco: Never  Vaping Use   Vaping status: Never Used  Substance and Sexual Activity   Alcohol  use: Yes    Comment: ocassionally wine   Drug use: No   Sexual activity: Not on file  Other Topics Concern   Not on file  Social History Narrative   Not on file   Social Drivers of Health   Financial Resource Strain: Low Risk  (08/19/2023)   Overall Financial Resource Strain (CARDIA)    Difficulty of Paying Living Expenses: Not hard at all  Food Insecurity: No Food Insecurity (08/19/2023)   Hunger Vital Sign    Worried About Running Out of Food in the Last Year: Never true    Ran Out of Food in the Last Year: Never true  Transportation Needs: No Transportation Needs (08/19/2023)   PRAPARE - Administrator, Civil Service (Medical): No    Lack of Transportation (Non-Medical): No  Physical Activity: Inactive (08/19/2023)   Exercise Vital Sign    Days of Exercise per Week: 0 days    Minutes of Exercise per Session: 0 min  Stress: No Stress Concern Present (08/19/2023)   [employer] of Occupational Health - Occupational Stress Questionnaire    Feeling of Stress: Only a little  Social Connections: Moderately Isolated (08/19/2023)   Social Connection and Isolation Panel    Frequency of Communication with Friends and Family: More than three times a week    Frequency of Social Gatherings with Friends and Family: More than three times a week    Attends Religious Services: More than 4 times per year    Active  Member of [employer] or Organizations: No    Attends Banker Meetings: Never    Marital Status: Widowed    Tobacco Counseling Counseling given: Not Answered    Clinical Intake:  Pre-visit preparation completed: Yes  Pain : No/denies pain     BMI - recorded: 20.3 Nutritional Status: BMI of 19-24  Normal Nutritional Risks: None Diabetes: No  Lab Results  Component Value Date   HGBA1C 5.7 (H) 09/25/2022   HGBA1C 5.6 08/14/2021   HGBA1C 5.7 (H) 01/22/2021     How often do you need to have someone help you when you read instructions, pamphlets, or other written materials from your doctor or pharmacy?: 1 - Never  Interpreter Needed?: No  Information entered by :: Anna DAS, LPN  Activities of Daily Living     08/19/2023    8:22 AM 09/25/2022   11:01 AM  In your present state of health, do you have any difficulty performing the following activities:  Hearing? 0 1  Vision? 0 0  Difficulty concentrating or making decisions? 0 0  Walking or climbing stairs? 0 0  Dressing or bathing? 0 0  Doing errands, shopping? 0 0  Preparing Food and eating ? N   Using the Toilet? N   In the past six months, have you accidently leaked urine? N   Do you have problems with loss of bowel control? N   Managing your Medications? N   Managing your Finances? N   Housekeeping or managing your Housekeeping? N     Patient Care Team: Sharma Coyer, MD as PCP - General (Family Medicine) Jaye Fallow, MD as Referring Physician (Ophthalmology)  I have updated your Care Teams any recent Medical Services you may have received from other providers in the past year.     Assessment:   This is a routine wellness examination for Anna Mcmillan.  Hearing/Vision screen Hearing Screening - Comments:: NO AIDS Vision Screening - Comments:: WEARS GLASSES ALL DAY- [facility]   Goals Addressed             This Visit's Progress    DIET - EAT MORE FRUITS AND VEGETABLES          Depression Screen     08/19/2023    8:18 AM 08/18/2022    8:31 AM 08/14/2021   10:00 AM 05/07/2021    9:04 AM 01/22/2021   10:00 AM 07/18/2020    9:43 AM 10/13/2019    9:16 AM  PHQ 2/9 Scores  PHQ - 2 Score 1 0 1 1 0 0 0  PHQ- 9 Score 1  1 3 2 1 3     Fall Risk     08/19/2023    8:22 AM 08/18/2022    8:09 AM 08/14/2021   10:00 AM 05/07/2021    9:04 AM 01/22/2021   10:00 AM  Fall Risk   Falls in the past year? 0 0 0 0 0  Number falls in past yr: 0 0 0 0 0  Injury with Fall? 0 0 0 0 1  Risk for fall due to : No Fall Risks No Fall Risks No Fall Risks No Fall Risks No Fall Risks  Follow up Falls evaluation completed Education provided;Falls prevention discussed Falls evaluation completed  Falls evaluation completed       Data saved with a previous flowsheet row definition    MEDICARE RISK AT HOME:  Medicare Risk at Home Any stairs in or around the home?: Yes If so, are there any without handrails?: No Home free of loose throw rugs in walkways, pet beds, electrical cords, etc?: Yes Adequate lighting in your home to reduce risk of falls?: Yes Life alert?: No Use of a cane, walker or w/c?: No Grab bars in the bathroom?: Yes Shower chair or bench in shower?: Yes Elevated toilet seat or a handicapped toilet?: Yes  TIMED UP AND GO:  Was the test performed?  No  Cognitive Function: 6CIT completed        08/19/2023    8:24 AM 08/18/2022    8:34 AM  6CIT Screen  What Year? 0 points 0 points  What month? 0 points 0 points  What time? 0 points 0 points  Count back from 20 0 points 0 points  Months in  reverse 0 points 0 points  Repeat phrase 0 points 0 points  Total Score 0 points 0 points    Immunizations Immunization History  Administered Date(s) Administered   Influenza, High Dose Seasonal PF 11/22/2014, 10/31/2015, 01/20/2017   Influenza-Unspecified 11/24/2013   Pneumococcal Conjugate-13 05/24/2014   Pneumococcal Polysaccharide-23 02/08/2013   Tdap 09/24/2005     Screening Tests Health Maintenance  Topic Date Due   COVID-19 Vaccine (1) Never done   Zoster Vaccines- Shingrix (1 of 2) Never done   DTaP/Tdap/Td (2 - Td or Tdap) 09/25/2015   DEXA SCAN  08/25/2023   INFLUENZA VACCINE  09/25/2023   Medicare Annual Wellness (AWV)  08/18/2024   Pneumococcal Vaccine: 50+ Years  Completed   Hepatitis C Screening  Completed   Hepatitis B Vaccines  Aged Out   HPV VACCINES  Aged Out   Meningococcal B Vaccine  Aged Out   Colonoscopy  Discontinued    Health Maintenance  Health Maintenance Due  Topic Date Due   COVID-19 Vaccine (1) Never done   Zoster Vaccines- Shingrix (1 of 2) Never done   DTaP/Tdap/Td (2 - Td or Tdap) 09/25/2015   Health Maintenance Items Addressed: Mammogram ordered; BDS ORDERED, AGED OF COLONOSCOPY; NEEDS TDAP & PNA; DECLINES COVIDS; NEVER HAD CHICKENPOX SO DOESN'T NEED SHINGRIX  Additional Screening:  Vision Screening: Recommended annual ophthalmology exams for early detection of glaucoma and other disorders of the eye. Would you like a referral to an eye doctor? No    Dental Screening: Recommended annual dental exams for proper oral hygiene  Community Resource Referral / Chronic Care Management: CRR required this visit?  No   CCM required this visit?  No   Plan:    I have personally reviewed and noted the following in the patient's chart:   Medical and social history Use of alcohol , tobacco or illicit drugs  Current medications and supplements including opioid prescriptions. Patient is not currently taking opioid prescriptions. Functional ability and status Nutritional status Physical activity Advanced directives List of other physicians Hospitalizations, surgeries, and ER visits in previous 12 months Vitals Screenings to include cognitive, depression, and falls Referrals and appointments  In addition, I have reviewed and discussed with patient certain preventive protocols, quality metrics, and best  practice recommendations. A written personalized care plan for preventive services as well as general preventive health recommendations were provided to patient.   Anna GORMAN Das, LPN   [id number]/74/[id number]   After Visit Summary: (MyChart) Due to this being a telephonic visit, the after visit summary with patients personalized plan was offered to patient via MyChart   Notes: MAMMOGRAM & BDS ORDERED

## 2023-09-16 ENCOUNTER — Other Ambulatory Visit: Payer: Self-pay | Admitting: Family Medicine

## 2023-09-16 DIAGNOSIS — I10 Essential (primary) hypertension: Secondary | ICD-10-CM

## 2023-09-16 NOTE — Telephone Encounter (Signed)
 LVM requesting patient call back to schedule appt, per Dr. JONELLE appointment needed before more refills can be given

## 2023-09-20 ENCOUNTER — Other Ambulatory Visit: Payer: Self-pay | Admitting: Family Medicine

## 2023-09-20 DIAGNOSIS — I10 Essential (primary) hypertension: Secondary | ICD-10-CM

## 2023-11-24 NOTE — Progress Notes (Signed)
 ATISHA HAMIDI                                          MRN: [id number]   11/24/2023   The [facility] Quality Team Specialist reviewed this patient medical record for the purposes of chart review for care gap closure. The following were reviewed: chart review for care gap closure-controlling blood pressure.    [facility] Quality Team

## 2023-12-03 ENCOUNTER — Encounter: Payer: Self-pay | Admitting: Family Medicine

## 2023-12-03 ENCOUNTER — Ambulatory Visit (INDEPENDENT_AMBULATORY_CARE_PROVIDER_SITE_OTHER): Admitting: Family Medicine

## 2023-12-03 VITALS — BP 132/74 | HR 91 | Temp 98.1°F | Ht 64.0 in | Wt 113.9 lb

## 2023-12-03 DIAGNOSIS — M810 Age-related osteoporosis without current pathological fracture: Secondary | ICD-10-CM | POA: Diagnosis not present

## 2023-12-03 DIAGNOSIS — Z0000 Encounter for general adult medical examination without abnormal findings: Secondary | ICD-10-CM | POA: Diagnosis not present

## 2023-12-03 DIAGNOSIS — D6489 Other specified anemias: Secondary | ICD-10-CM | POA: Diagnosis not present

## 2023-12-03 DIAGNOSIS — E7800 Pure hypercholesterolemia, unspecified: Secondary | ICD-10-CM

## 2023-12-03 DIAGNOSIS — Z8639 Personal history of other endocrine, nutritional and metabolic disease: Secondary | ICD-10-CM | POA: Diagnosis not present

## 2023-12-03 DIAGNOSIS — Z853 Personal history of malignant neoplasm of breast: Secondary | ICD-10-CM | POA: Diagnosis not present

## 2023-12-03 DIAGNOSIS — I10 Essential (primary) hypertension: Secondary | ICD-10-CM

## 2023-12-03 DIAGNOSIS — Z131 Encounter for screening for diabetes mellitus: Secondary | ICD-10-CM

## 2023-12-03 MED ORDER — AMLODIPINE BESYLATE 5 MG PO TABS
5.0000 mg | ORAL_TABLET | Freq: Every day | ORAL | 3 refills | Status: AC
Start: 2023-12-03 — End: ?

## 2023-12-03 NOTE — Patient Instructions (Addendum)
  [facility] at [facility] [address] [place],  [place]  [address] Main: [phone]    Remember to ask the pharmacist about your updated tetanus vaccine and Shingrix vaccine   To keep you healthy, please keep in mind the following health maintenance items that you are due for:   Health Maintenance Due  Topic Date Due   Zoster Vaccines- Shingrix (1 of 2) Never done   DTaP/Tdap/Td (2 - Td or Tdap) 09/25/2015   Mammogram  03/07/2023   DEXA SCAN  08/25/2023     Best Wishes,   Dr. Lang

## 2023-12-03 NOTE — Assessment & Plan Note (Signed)
 Chronic  Recommended to continue with dietary management limited fried foods and animal fats  Will repeat lipid panel  Given age >55 will discuss cholesterol goal pending lab results

## 2023-12-03 NOTE — Assessment & Plan Note (Signed)
 Anemia Chronic  - Order CBC to monitor for anemia.

## 2023-12-03 NOTE — Assessment & Plan Note (Signed)
 Chronic  Last DEXA (T-score -2.8 in 2020) was consistent with osteoporosis Patient is not currently on therapy for this problem  No recent pathological fractures  Recommend goals of 1200mg  calcium  daily and minimum of 800 units Vitamin D 

## 2023-12-03 NOTE — Assessment & Plan Note (Signed)
 Graves' disease Previous severe symptoms including hair loss and significant weight loss. Family history of thyroid  issues noted. Stress previously identified as a trigger. - Order TSH and T4 to monitor thyroid  function.

## 2023-12-03 NOTE — Assessment & Plan Note (Signed)
 Breast cancer, currently under surveillance Breast cancer treated in 2010-2011 with aggressive therapy. Currently under surveillance with annual mammograms. Last mammogram was over a year ago. - Schedule mammogram for breast cancer surveillance.

## 2023-12-03 NOTE — Progress Notes (Signed)
 Complete physical exam   Patient: Anna Mcmillan   DOB: [birth year]   78 y.o. Female  MRN: [id number] Visit Date: 12/03/2023  Today's healthcare provider: Rockie Agent, MD   Chief Complaint  Patient presents with   Annual Exam    Patient is present for annual exam with pcp. Patient eats well with fruits and veggies and protein. No specific exercise, does do gardening and mowing at home.  Vaccines: Flu, shingles and Tdap- Screenings: DEXA + Mammogram-   Subjective    Anna Mcmillan is a 78 y.o. female who presents today for a complete physical exam.   She does not have additional problems to discuss today.   Discussed the use of AI scribe software for clinical note transcription with the patient, who gave verbal consent to proceed.  History of Present Illness Anna Mcmillan is a 78 year old female who presents for an annual physical exam.  She has a history of hypertension.  Her history of breast cancer was diagnosed in 2010-2011, for which she underwent aggressive treatment. She has been under surveillance since then, with her last mammogram over a year ago. She typically has annual mammograms around October.  She has a history of Graves' disease, initially triggered by stress, with significant symptoms including hair loss, weight loss, and a dangerously high heart rate, which required hospitalization. She was treated with medication to manage her thyroid  hormone levels, which have since stabilized. There is a family history of thyroid  issues affecting her mother, daughter, and son.  She experiences some memory loss, humorously noting that she gets her 'three thousand steps in every day looking for my phone, my eyeglasses and my car keys.'   Past Medical History:  Diagnosis Date   Arthritis    BPPV (benign paroxysmal positional vertigo), bilateral 01/01/2022   Cancer (HCC) 02/25/2008   left breast    Graves disease 02/24/1994   H/O renal calculi 11/22/2014    Recurrent   Hypertension 02/24/1982   Pulmonary fibrosis (HCC) 02/24/2010   Thyroid  disease    Past Surgical History:  Procedure Laterality Date   ABDOMINAL HYSTERECTOMY  2001   BREAST BIOPSY Left 2002   BREAST SURGERY Left 2002   lumpectomy,    COLONOSCOPY  2002   COSMETIC SURGERY Left    implant 1977   EXTRACORPOREAL SHOCK WAVE LITHOTRIPSY Left 08/13/2017   Procedure: EXTRACORPOREAL SHOCK WAVE LITHOTRIPSY (ESWL);  Surgeon: Penne Knee, MD;  Location: [facility];  Service: Urology;  Laterality: Left;   Social History   Socioeconomic History   Marital status: Widowed    Spouse name: Not on file   Number of children: 3   Years of education: Not on file   Highest education level: Some college, no degree  Occupational History   Occupation: retired  Tobacco Use   Smoking status: Never   Smokeless tobacco: Never  Vaping Use   Vaping status: Never Used  Substance and Sexual Activity   Alcohol  use: Yes    Comment: ocassionally wine   Drug use: No   Sexual activity: Not Currently  Other Topics Concern   Not on file  Social History Narrative   Not on file   Social Drivers of Health   Financial Resource Strain: Low Risk  (08/19/2023)   Overall Financial Resource Strain (CARDIA)    Difficulty of Paying Living Expenses: Not hard at all  Food Insecurity: No Food Insecurity (08/19/2023)   Hunger Vital Sign    Worried About  Running Out of Food in the Last Year: Never true    Ran Out of Food in the Last Year: Never true  Transportation Needs: No Transportation Needs (08/19/2023)   PRAPARE - Administrator, Civil Service (Medical): No    Lack of Transportation (Non-Medical): No  Physical Activity: Inactive (08/19/2023)   Exercise Vital Sign    Days of Exercise per Week: 0 days    Minutes of Exercise per Session: 0 min  Stress: No Stress Concern Present (08/19/2023)   [employer] of Occupational Health - Occupational Stress Questionnaire    Feeling of Stress:  Only a little  Social Connections: Moderately Isolated (08/19/2023)   Social Connection and Isolation Panel    Frequency of Communication with Friends and Family: More than three times a week    Frequency of Social Gatherings with Friends and Family: More than three times a week    Attends Religious Services: More than 4 times per year    Active Member of [employer] or Organizations: No    Attends Banker Meetings: Never    Marital Status: Widowed  Intimate Partner Violence: Not At Risk (08/19/2023)   Humiliation, Afraid, Rape, and Kick questionnaire    Fear of Current or Ex-Partner: No    Emotionally Abused: No    Physically Abused: No    Sexually Abused: No   Family Status  Relation Name Status   Mother  Deceased   Father  Deceased at age 71       Biological father MVA   Sister  Deceased   Brother 1 Deceased       died from a MVA   Brother 2 Alive   Brother 3 Alive   Brother 4 Corporate treasurer  (Not Specified)  No partnership data on file   Family History  Problem Relation Age of Onset   Kidney disease Mother        Kidney failure/ received a kidney transplant when she was 31   Breast cancer Daughter 70       genetic negative   Allergies  Allergen Reactions   Sulfa Antibiotics Anaphylaxis and Rash   Sulfasalazine Anaphylaxis and Rash   Statins Other (See Comments)    Severe abdominal pain.   Tamsulosin      Dry cough     Medications: Outpatient Medications Prior to Visit  Medication Sig   [DISCONTINUED] amLODipine  (NORVASC ) 5 MG tablet TAKE 1 TABLET (5 MG TOTAL) BY MOUTH DAILY.   [DISCONTINUED] Ascorbic Acid (VITAMIN C) 1000 MG tablet Take 1,000 mg by mouth daily. (Patient not taking: Reported on 08/19/2023)   [DISCONTINUED] mometasone  (NASONEX ) 50 MCG/ACT nasal spray PLACE 2 SPRAYS INTO THE NOSTRIL ONCE DAILY.   [DISCONTINUED] Multiple Vitamins-Minerals (MULTIVITAMIN GUMMIES WOMENS PO) Take by mouth daily. High in D3   [DISCONTINUED] Zinc 40 MG TABS Take  40 mg by mouth daily. (Patient not taking: Reported on 08/19/2023)   No facility-administered medications prior to visit.    Review of Systems  Last CBC Lab Results  Component Value Date   WBC 5.5 09/25/2022   HGB 14.6 09/25/2022   HCT 45.1 09/25/2022   MCV 92 09/25/2022   MCH 29.9 09/25/2022   RDW 13.0 09/25/2022   PLT 266 09/25/2022   Last metabolic panel Lab Results  Component Value Date   GLUCOSE 109 (H) 09/25/2022   NA 141 09/25/2022   K 4.2 09/25/2022   CL 102 09/25/2022   CO2 25 09/25/2022  BUN 8 09/25/2022   CREATININE 0.78 09/25/2022   EGFR 78 09/25/2022   CALCIUM  9.8 09/25/2022   PROT 6.9 09/25/2022   ALBUMIN 4.5 09/25/2022   LABGLOB 2.4 09/25/2022   AGRATIO 1.6 08/14/2021   BILITOT 0.3 09/25/2022   ALKPHOS 95 09/25/2022   AST 28 09/25/2022   ALT 18 09/25/2022   ANIONGAP 11 08/06/2017   Last lipids Lab Results  Component Value Date   CHOL 340 (H) 09/25/2022   HDL 98 09/25/2022   LDLCALC 234 (H) 09/25/2022   TRIG 63 09/25/2022   CHOLHDL 3.5 09/25/2022   Last hemoglobin A1c Lab Results  Component Value Date   HGBA1C 5.7 (H) 09/25/2022   Last thyroid  functions Lab Results  Component Value Date   TSH 1.050 09/25/2022   Last vitamin D  No results found for: 25OHVITD2, 25OHVITD3, VD25OH Last vitamin B12 and Folate No results found for: VITAMINB12, FOLATE      Objective    BP 132/74 (BP Location: Right Arm, Patient Position: Sitting, Cuff Size: Normal)   Pulse 91   Temp 98.1 F (36.7 C) (Oral)   Ht 5' 4 (1.626 m)   Wt 113 lb 14.4 oz (51.7 kg)   SpO2 100%   BMI 19.55 kg/m  BP Readings from Last 3 Encounters:  12/03/23 132/74  09/25/22 128/76  04/03/22 (!) 151/78   Wt Readings from Last 3 Encounters:  12/03/23 113 lb 14.4 oz (51.7 kg)  09/25/22 118 lb 4.8 oz (53.7 kg)  08/18/22 113 lb (51.3 kg)        Physical Exam  Physical Exam HEENT: Normal oropharynx, moist mucous membranes. NECK: Neck non-tender. CHEST:  Clear to auscultation bilaterally. CARDIOVASCULAR: No carotid bruits. ABDOMEN: Soft. MUSCULOSKELETAL: 5/5 strength in upper and lower extremities. NEUROLOGICAL: Cranial nerves II-XII grossly intact.    Last depression screening scores    12/03/2023    2:04 PM 08/19/2023    8:18 AM 08/18/2022    8:31 AM  PHQ 2/9 Scores  PHQ - 2 Score 1 1 0  PHQ- 9 Score 4 1     Last fall risk screening    12/03/2023    2:04 PM  Fall Risk   Falls in the past year? 0  Number falls in past yr: 0  Injury with Fall? 0  Risk for fall due to : No Fall Risks  Follow up Falls evaluation completed    Last Audit-C alcohol  use screening    08/19/2023    8:17 AM  Alcohol  Use Disorder Test (AUDIT)  1. How often do you have a drink containing alcohol ? 1  2. How many drinks containing alcohol  do you have on a typical day when you are drinking? 0  3. How often do you have six or more drinks on one occasion? 0  AUDIT-C Score 1   A score of 3 or more in women, and 4 or more in men indicates increased risk for alcohol  abuse, EXCEPT if all of the points are from question 1   No results found for any visits on 12/03/23.  Assessment & Plan    Routine Health Maintenance and Physical Exam  Immunization History  Administered Date(s) Administered   INFLUENZA, HIGH DOSE SEASONAL PF 11/22/2014, 10/31/2015, 01/20/2017   Influenza-Unspecified 11/24/2013   Pneumococcal Conjugate-13 05/24/2014   Pneumococcal Polysaccharide-23 02/08/2013   Tdap 09/24/2005    Health Maintenance  Topic Date Due   Zoster Vaccines- Shingrix (1 of 2) Never done   DTaP/Tdap/Td (2 - Td or Tdap)  09/25/2015   Mammogram  03/07/2023   DEXA SCAN  08/25/2023   COVID-19 Vaccine (1) 12/19/2023 (Originally 02/24/1950)   Influenza Vaccine  05/24/2024 (Originally 09/25/2023)   Medicare Annual Wellness (AWV)  08/18/2024   Pneumococcal Vaccine: 50+ Years  Completed   Hepatitis C Screening  Completed   Meningococcal B Vaccine  Aged Out    Colonoscopy  Discontinued    Problem List Items Addressed This Visit     Absolute anemia   Anemia Chronic  - Order CBC to monitor for anemia.      Relevant Orders   CBC   Annual physical exam - Primary   Routine health maintenance and screenings are due. She declined flu vaccine but is considering shingles and tetanus vaccines. Bone density scan previously ordered but not yet scheduled. - Order CMP, CBC, lipid panel, and A1c for routine screening. - Recommend tetanus and Shingrix vaccines at pharmacy. - Rec pt schedule bone density scan. - Provide information for scheduling bone density scan and mammogram.      Essential (primary) hypertension   Chronic  Improved on repeat to 132/74  - resume amlodipine  5mg        Relevant Medications   amLODipine  (NORVASC ) 5 MG tablet   Other Relevant Orders   Comprehensive Metabolic Panel (CMET)   History of breast cancer   Breast cancer, currently under surveillance Breast cancer treated in 2010-2011 with aggressive therapy. Currently under surveillance with annual mammograms. Last mammogram was over a year ago. - Schedule mammogram for breast cancer surveillance.      History of Graves' disease   Graves' disease Previous severe symptoms including hair loss and significant weight loss. Family history of thyroid  issues noted. Stress previously identified as a trigger. - Order TSH and T4 to monitor thyroid  function.      Relevant Orders   TSH + free T4   Hypercholesteremia   Chronic  Recommended to continue with dietary management limited fried foods and animal fats  Will repeat lipid panel  Given age >35 will discuss cholesterol goal pending lab results           Relevant Medications   amLODipine  (NORVASC ) 5 MG tablet   Other Relevant Orders   Lipid panel   OP (osteoporosis)   Chronic  Last DEXA (T-score -2.8 in 2020) was consistent with osteoporosis Patient is not currently on therapy for this problem  No recent  pathological fractures  Recommend goals of 1200mg  calcium  daily and minimum of 800 units Vitamin D         Other Visit Diagnoses       Diabetes mellitus screening       Relevant Orders   Hemoglobin A1c       Assessment and Plan Assessment & Plan       Return in about 6 months (around 06/02/2024) for Chronic F/U.       Rockie Agent, MD  [facility] [phone] (phone) [phone] (fax)  [facility]

## 2023-12-03 NOTE — Assessment & Plan Note (Signed)
 Chronic  Improved on repeat to 132/74  - resume amlodipine  5mg 

## 2023-12-03 NOTE — Assessment & Plan Note (Signed)
 Routine health maintenance and screenings are due. She declined flu vaccine but is considering shingles and tetanus vaccines. Bone density scan previously ordered but not yet scheduled. - Order CMP, CBC, lipid panel, and A1c for routine screening. - Recommend tetanus and Shingrix vaccines at pharmacy. - Rec pt schedule bone density scan. - Provide information for scheduling bone density scan and mammogram.

## 2023-12-04 LAB — COMPREHENSIVE METABOLIC PANEL WITH GFR
ALT: 16 IU/L (ref 0–32)
AST: 29 IU/L (ref 0–40)
Albumin: 4.7 g/dL (ref 3.8–4.8)
Alkaline Phosphatase: 98 IU/L (ref 49–135)
BUN/Creatinine Ratio: 11 — ABNORMAL LOW (ref 12–28)
BUN: 10 mg/dL (ref 8–27)
Bilirubin Total: 0.5 mg/dL (ref 0.0–1.2)
CO2: 24 mmol/L (ref 20–29)
Calcium: 10 mg/dL (ref 8.7–10.3)
Chloride: 98 mmol/L (ref 96–106)
Creatinine, Ser: 0.9 mg/dL (ref 0.57–1.00)
Globulin, Total: 2.9 g/dL (ref 1.5–4.5)
Glucose: 95 mg/dL (ref 70–99)
Potassium: 4.1 mmol/L (ref 3.5–5.2)
Sodium: 139 mmol/L (ref 134–144)
Total Protein: 7.6 g/dL (ref 6.0–8.5)
eGFR: 65 mL/min/1.73 (ref >59)

## 2023-12-04 LAB — CBC
Hematocrit: 49.1 % — ABNORMAL HIGH (ref 34.0–46.6)
Hemoglobin: 16.2 g/dL — ABNORMAL HIGH (ref 11.1–15.9)
MCH: 31.2 pg (ref 26.6–33.0)
MCHC: 33 g/dL (ref 31.5–35.7)
MCV: 95 fL (ref 79–97)
Platelets: 266 x10E3/uL (ref 150–450)
RBC: 5.19 x10E6/uL (ref 3.77–5.28)
RDW: 12.1 % (ref 11.7–15.4)
WBC: 7.3 x10E3/uL (ref 3.4–10.8)

## 2023-12-04 LAB — HEMOGLOBIN A1C
Est. average glucose Bld gHb Est-mCnc: 114 mg/dL
Hgb A1c MFr Bld: 5.6 % (ref 4.8–5.6)

## 2023-12-04 LAB — LIPID PANEL
Chol/HDL Ratio: 3.5 ratio (ref 0.0–4.4)
Cholesterol, Total: 328 mg/dL — ABNORMAL HIGH (ref 100–199)
HDL: 95 mg/dL (ref >39)
LDL Chol Calc (NIH): 214 mg/dL — ABNORMAL HIGH (ref 0–99)
Triglycerides: 111 mg/dL (ref 0–149)
VLDL Cholesterol Cal: 19 mg/dL (ref 5–40)

## 2023-12-04 LAB — TSH+FREE T4
Free T4: 1.33 ng/dL (ref 0.82–1.77)
TSH: 0.935 u[IU]/mL (ref 0.450–4.500)

## 2023-12-10 NOTE — Progress Notes (Signed)
 Anna Mcmillan                                          MRN: [id number]   12/10/2023   The [facility] Quality Team Specialist reviewed this patient medical record for the purposes of chart review for care gap closure. The following were reviewed: abstraction for care gap closure-controlling blood pressure.    [facility] Quality Team

## 2023-12-24 ENCOUNTER — Other Ambulatory Visit: Payer: Self-pay

## 2023-12-24 ENCOUNTER — Ambulatory Visit: Payer: Self-pay | Admitting: Family Medicine

## 2023-12-24 DIAGNOSIS — E7800 Pure hypercholesterolemia, unspecified: Secondary | ICD-10-CM

## 2023-12-24 DIAGNOSIS — Z131 Encounter for screening for diabetes mellitus: Secondary | ICD-10-CM

## 2023-12-24 MED ORDER — ROSUVASTATIN CALCIUM 5 MG PO TABS: 5.0000 mg | ORAL_TABLET | Freq: Every day | ORAL | 0 refills | Status: AC

## 2024-06-02 ENCOUNTER — Ambulatory Visit: Admitting: Family Medicine

## 2024-08-23 ENCOUNTER — Ambulatory Visit
